# Patient Record
Sex: Female | Born: 1975 | Hispanic: Yes | Marital: Single | Smoking: Never smoker
Health system: Southern US, Community
[De-identification: ages and names within clinical notes are randomized; demographics above are authoritative.]

---

## 2014-09-03 ENCOUNTER — Encounter: Payer: Self-pay | Admitting: Physician Assistant

## 2014-09-03 ENCOUNTER — Ambulatory Visit (INDEPENDENT_AMBULATORY_CARE_PROVIDER_SITE_OTHER): Payer: 59 | Admitting: Physician Assistant

## 2014-09-03 VITALS — BP 117/73 | HR 96 | Ht 62.5 in | Wt 137.0 lb

## 2014-09-03 DIAGNOSIS — N912 Amenorrhea, unspecified: Secondary | ICD-10-CM | POA: Diagnosis not present

## 2014-09-03 DIAGNOSIS — M533 Sacrococcygeal disorders, not elsewhere classified: Secondary | ICD-10-CM

## 2014-09-03 DIAGNOSIS — R109 Unspecified abdominal pain: Secondary | ICD-10-CM

## 2014-09-03 DIAGNOSIS — Z131 Encounter for screening for diabetes mellitus: Secondary | ICD-10-CM

## 2014-09-03 DIAGNOSIS — Z1231 Encounter for screening mammogram for malignant neoplasm of breast: Secondary | ICD-10-CM

## 2014-09-03 DIAGNOSIS — E28319 Asymptomatic premature menopause: Secondary | ICD-10-CM

## 2014-09-03 DIAGNOSIS — R1032 Left lower quadrant pain: Secondary | ICD-10-CM

## 2014-09-03 DIAGNOSIS — Z13220 Encounter for screening for lipoid disorders: Secondary | ICD-10-CM

## 2014-09-03 MED ORDER — MELOXICAM 15 MG PO TABS: 15.0000 mg | ORAL_TABLET | Freq: Every day | ORAL | Status: DC

## 2014-09-03 MED ORDER — DICLOFENAC SODIUM 2 % TD SOLN: TRANSDERMAL | Status: DC

## 2014-09-03 MED ORDER — KETOROLAC TROMETHAMINE 60 MG/2ML IM SOLN
60.0000 mg | Freq: Once | INTRAMUSCULAR | Status: AC
  Administered 2014-09-03: 60 mg via INTRAMUSCULAR

## 2014-09-03 NOTE — Patient Instructions (Signed)
Start mobic daily.  pennsaid cream twice a day.  Get blood work needs to be fasting.  Follow up in 2 weeks.

## 2014-09-04 LAB — COMPLETE METABOLIC PANEL WITH GFR
ALK PHOS: 66 U/L (ref 39–117)
ALT: 17 U/L (ref 0–35)
AST: 22 U/L (ref 0–37)
Albumin: 3.9 g/dL (ref 3.5–5.2)
BUN: 11 mg/dL (ref 6–23)
CO2: 25 mEq/L (ref 19–32)
CREATININE: 0.58 mg/dL (ref 0.50–1.10)
Calcium: 9.1 mg/dL (ref 8.4–10.5)
Chloride: 103 mEq/L (ref 96–112)
GFR, Est Non African American: >89 mL/min
GLUCOSE: 79 mg/dL (ref 70–99)
Potassium: 4.1 mEq/L (ref 3.5–5.3)
Sodium: 137 mEq/L (ref 135–145)
TOTAL PROTEIN: 6.6 g/dL (ref 6.0–8.3)
Total Bilirubin: 0.3 mg/dL (ref 0.2–1.2)

## 2014-09-04 LAB — LIPID PANEL
CHOLESTEROL: 190 mg/dL (ref 0–200)
HDL: 54 mg/dL (ref >=46)
LDL CALC: 119 mg/dL — AB (ref 0–99)
Total CHOL/HDL Ratio: 3.5 Ratio
Triglycerides: 83 mg/dL (ref <150)
VLDL: 17 mg/dL (ref 0–40)

## 2014-09-05 ENCOUNTER — Telehealth: Payer: Self-pay | Admitting: *Deleted

## 2014-09-05 ENCOUNTER — Other Ambulatory Visit: Payer: Self-pay | Admitting: Physician Assistant

## 2014-09-05 DIAGNOSIS — E28319 Asymptomatic premature menopause: Secondary | ICD-10-CM | POA: Insufficient documentation

## 2014-09-05 LAB — FOLLICLE STIMULATING HORMONE: FSH: 55.8 m[IU]/mL

## 2014-09-05 LAB — LUTEINIZING HORMONE: LH: 45.5 m[IU]/mL

## 2014-09-05 MED ORDER — TRAMADOL HCL 50 MG PO TABS: 50.0000 mg | ORAL_TABLET | Freq: Three times a day (TID) | ORAL | Status: DC | PRN

## 2014-09-05 NOTE — Telephone Encounter (Signed)
Called & spoke with Lisa Randolph @ [phone] for Ms. Lisa Randolph Informed that Rx is ready for pick-up & could be picked  @ the urgent care.  And [facility] if no relief after taking Rx.

## 2014-09-05 NOTE — Telephone Encounter (Signed)
Patient came in for visit on 4/11 with SI joint pain. Which she received an injection.  Patient stated that pain has now returned  & is unbearable. Wonders if anything can help ease the pain

## 2014-09-05 NOTE — Telephone Encounter (Signed)
Closing accidental encouter

## 2014-09-05 NOTE — Telephone Encounter (Signed)
Can pick up tramadol prescription as needed. If no improvement in next week then lets get in to formal PT.  i will print off tramadol rx.

## 2014-09-07 ENCOUNTER — Telehealth: Payer: Self-pay | Admitting: *Deleted

## 2014-09-07 NOTE — Telephone Encounter (Signed)
Patients friend [name] called asking if Mrs Lisa Randolph could have menopausal medication  because she is having bad effects.

## 2014-09-09 ENCOUNTER — Encounter: Payer: Self-pay | Admitting: Physician Assistant

## 2014-09-09 ENCOUNTER — Telehealth: Payer: Self-pay | Admitting: Physician Assistant

## 2014-09-09 DIAGNOSIS — M533 Sacrococcygeal disorders, not elsewhere classified: Secondary | ICD-10-CM | POA: Insufficient documentation

## 2014-09-09 DIAGNOSIS — N912 Amenorrhea, unspecified: Secondary | ICD-10-CM | POA: Insufficient documentation

## 2014-09-09 DIAGNOSIS — R1032 Left lower quadrant pain: Secondary | ICD-10-CM | POA: Insufficient documentation

## 2014-09-09 DIAGNOSIS — R109 Unspecified abdominal pain: Secondary | ICD-10-CM | POA: Insufficient documentation

## 2014-09-09 NOTE — Telephone Encounter (Signed)
Disregard last msg i saw in historical medications.

## 2014-09-09 NOTE — Telephone Encounter (Signed)
Let's evaluate with pelvic and transvaginal ultrasound first. Will order.

## 2014-09-09 NOTE — Telephone Encounter (Signed)
i never saw injection logged for toradol that was given?

## 2014-09-09 NOTE — Progress Notes (Signed)
   Subjective:    Patient ID: Lisa Randolph, female    DOB: 07/09/1975, 39 y.o.   MRN: [id number]  HPI Pt is a 39 yo female who presents to the clinic to establish care.   No known PMH>  No know FMH.  No medications.  No surgeries.   .. History   Social History  . Marital Status: Single    Spouse Name: N/A  . Number of Children: N/A  . Years of Education: N/A   Occupational History  . Not on file.   Social History Main Topics  . Smoking status: Never Smoker   . Smokeless tobacco: Not on file  . Alcohol Use: No  . Drug Use: No  . Sexual Activity: Not on file   Other Topics Concern  . Not on file   Social History Narrative   Pt is hispanic and bring in friend to help interpret.   Pt has some left lower back pain. Worse when sitting and better with walking. Present off and on for years but seems to be getting worse. No known trauma. No radiation down into legs. No saddle anesthesia. Not tried anything to make better   Pt also has not had a period since January. She is also having abdominal pressure and some pain. She has had a few hot flashes.    Review of Systems  All other systems reviewed and are negative.      Objective:   Physical Exam  Constitutional: She is oriented to person, place, and time. She appears well-developed and well-nourished.  HENT:  Head: Normocephalic and atraumatic.  Cardiovascular: Normal rate, regular rhythm and normal heart sounds.   Pulmonary/Chest: Effort normal and breath sounds normal.  Abdominal: Soft. Bowel sounds are normal. She exhibits no distension and no mass. There is no tenderness. There is no rebound and no guarding.  Musculoskeletal:  Pain to palpation over left lower back only.  No pain over lumbar spine.  Negative straight leg test.  No pain over hip bursa.   Neurological: She is alert and oriented to person, place, and time.  Skin: Skin is dry.  Psychiatric: She has a normal mood and affect. Her behavior is normal.           Assessment & Plan:  SI joint dysfunction- Toradol 60mg  IM given today. Will hold off on exercises. Given mobic to start tomorrow and take daily for 2 weeks. pennsaid over effected area bid. Ice area. Follow up in 2 weeks. Pt called later and did send some tramadol for acute pain.   Amenorrhea/left sided abdominal pressure and pain- will get labs [facility] and men[facility]. Will get pelvic ultrasound for pain and pressure.   Pt needs CPE.  Will order mammogram.  Follow up with pap.

## 2014-09-10 NOTE — Telephone Encounter (Signed)
Left Vm for [name] Mrs Shawnie DapperLopez friend that [facility] suggested:    Let's evaluate with pelvic and transvaginal ultrasound first

## 2014-09-10 NOTE — Telephone Encounter (Signed)
Also can we see how patients left lower back pain is doing?

## 2014-09-14 ENCOUNTER — Ambulatory Visit (INDEPENDENT_AMBULATORY_CARE_PROVIDER_SITE_OTHER): Payer: 59

## 2014-09-14 DIAGNOSIS — N858 Other specified noninflammatory disorders of uterus: Secondary | ICD-10-CM

## 2014-09-17 ENCOUNTER — Ambulatory Visit (INDEPENDENT_AMBULATORY_CARE_PROVIDER_SITE_OTHER): Payer: 59 | Admitting: Physician Assistant

## 2014-09-17 ENCOUNTER — Ambulatory Visit (INDEPENDENT_AMBULATORY_CARE_PROVIDER_SITE_OTHER): Payer: 59

## 2014-09-17 ENCOUNTER — Encounter: Payer: Self-pay | Admitting: Physician Assistant

## 2014-09-17 ENCOUNTER — Other Ambulatory Visit (HOSPITAL_COMMUNITY)
Admission: RE | Admit: 2014-09-17 | Discharge: 2014-09-17 | Disposition: A | Discharge status: Home / Self Care | Payer: 59 | Source: Ambulatory Visit | Attending: Physician Assistant | Admitting: Physician Assistant

## 2014-09-17 VITALS — BP 104/64 | HR 88 | Ht 62.5 in | Wt 132.0 lb

## 2014-09-17 DIAGNOSIS — Z0000 Encounter for general adult medical examination without abnormal findings: Secondary | ICD-10-CM

## 2014-09-17 DIAGNOSIS — E049 Nontoxic goiter, unspecified: Secondary | ICD-10-CM | POA: Diagnosis not present

## 2014-09-17 DIAGNOSIS — N951 Menopausal and female climacteric states: Secondary | ICD-10-CM

## 2014-09-17 DIAGNOSIS — M545 Low back pain, unspecified: Secondary | ICD-10-CM

## 2014-09-17 DIAGNOSIS — Z1151 Encounter for screening for human papillomavirus (HPV): Secondary | ICD-10-CM | POA: Diagnosis present

## 2014-09-17 DIAGNOSIS — Z01419 Encounter for gynecological examination (general) (routine) without abnormal findings: Secondary | ICD-10-CM

## 2014-09-17 DIAGNOSIS — N760 Acute vaginitis: Secondary | ICD-10-CM | POA: Insufficient documentation

## 2014-09-17 DIAGNOSIS — Z1239 Encounter for other screening for malignant neoplasm of breast: Secondary | ICD-10-CM

## 2014-09-17 DIAGNOSIS — Z23 Encounter for immunization: Secondary | ICD-10-CM

## 2014-09-17 DIAGNOSIS — M5136 Other intervertebral disc degeneration, lumbar region: Secondary | ICD-10-CM

## 2014-09-17 DIAGNOSIS — Z113 Encounter for screening for infections with a predominantly sexual mode of transmission: Secondary | ICD-10-CM | POA: Diagnosis present

## 2014-09-17 DIAGNOSIS — M533 Sacrococcygeal disorders, not elsewhere classified: Secondary | ICD-10-CM | POA: Diagnosis not present

## 2014-09-17 MED ORDER — MELOXICAM 15 MG PO TABS: 15.0000 mg | ORAL_TABLET | Freq: Every day | ORAL | Status: DC

## 2014-09-17 MED ORDER — ESTRADIOL 0.025 MG/24HR TD PTTW: 1.0000 | MEDICATED_PATCH | TRANSDERMAL | Status: DC

## 2014-09-17 MED ORDER — PROGESTERONE MICRONIZED 200 MG PO CAPS: 200.0000 mg | ORAL_CAPSULE | Freq: Every day | ORAL | Status: DC

## 2014-09-17 NOTE — Patient Instructions (Addendum)
Menopause Menopause is the normal time of life when menstrual periods stop completely. Menopause is complete when you have missed 12 consecutive menstrual periods. It usually occurs between the ages of 48 years and 55 years. Very rarely does a woman develop menopause before the age of 40 years. At menopause, your ovaries stop producing the female hormones estrogen and progesterone. This can cause undesirable symptoms and also affect your health. Sometimes the symptoms may occur 4-5 years before the menopause begins. There is no relationship between menopause and:  Oral contraceptives.  Number of children you had.  Race.  The age your menstrual periods started (menarche). Heavy smokers and very thin women may develop menopause earlier in life. CAUSES 1. The ovaries stop producing the female hormones estrogen and progesterone. 2. Other causes include: 1. Surgery to remove both ovaries. 2. The ovaries stop functioning for no known reason. 3. Tumors of the pituitary gland in the brain. 4. Medical disease that affects the ovaries and hormone production. 5. Radiation treatment to the abdomen or pelvis. 6. Chemotherapy that affects the ovaries. SYMPTOMS  1. Hot flashes. 2. Night sweats. 3. Decrease in sex drive. 4. Vaginal dryness and thinning of the vagina causing painful intercourse. 5. Dryness of the skin and developing wrinkles. 6. Headaches. 7. Tiredness. 8. Irritability. 9. Memory problems. 10. Weight gain. 11. Bladder infections. 12. Hair growth of the face and chest. 13. Infertility. More serious symptoms include: 1. Loss of bone (osteoporosis) causing breaks (fractures). 2. Depression. 3. Hardening and narrowing of the arteries (atherosclerosis) causing heart attacks and strokes. DIAGNOSIS   When the menstrual periods have stopped for 12 straight months.  Physical exam.  Hormone studies of the blood. TREATMENT  There are many treatment choices and nearly as many  questions about them. The decisions to treat or not to treat menopausal changes is an individual choice made with your health care provider. Your health care provider can discuss the treatments with you. Together, you can decide which treatment will work best for you. Your treatment choices may include:  1. Hormone therapy (estrogen and progesterone). 2. Non-hormonal medicines. 3. Treating the individual symptoms with medicine (for example antidepressants for depression). 4. Herbal medicines that may help specific symptoms. 5. Counseling by a psychiatrist or psychologist. 6. Group therapy. 7. Lifestyle changes including: 1. Eating healthy. 2. Regular exercise. 3. Limiting caffeine and alcohol. 4. Stress management and meditation. 8. No treatment. HOME CARE INSTRUCTIONS   Take the medicine your health care provider gives you as directed.  Get plenty of sleep and rest.  Exercise regularly.  Eat a diet that contains calcium (good for the bones) and soy products (acts like estrogen hormone).  Avoid alcoholic beverages.  Do not smoke.  If you have hot flashes, dress in layers.  Take supplements, calcium, and vitamin D to strengthen bones.  You can use over-the-counter lubricants or moisturizers for vaginal dryness.  Group therapy is sometimes very helpful.  Acupuncture may be helpful in some cases. SEEK MEDICAL CARE IF:   You are not sure you are in menopause.  You are having menopausal symptoms and need advice and treatment.  You are still having menstrual periods after age 36 years.  You have pain with intercourse.  Menopause is complete (no menstrual period for 12 months) and you develop vaginal bleeding.  You need a referral to a specialist (gynecologist, psychiatrist, or psychologist) for treatment. SEEK IMMEDIATE MEDICAL CARE IF:   You have severe depression.  You have excessive vaginal bleeding.  You fell and think you have a broken bone.  You have pain when  you urinate.  You develop leg or chest pain.  You have a fast pounding heart beat (palpitations).  You have severe headaches.  You develop vision problems.  You feel a lump in your breast.  You have abdominal pain or severe indigestion. Document Released: 08/01/2003 Document Revised: 01/11/2013 Document Reviewed: 12/08/2012 [facility] Patient Information 2015 [place], [place]. This information is not intended to replace advice given to you by your health care provider. Make sure you discuss any questions you have with your health care provider.   Keeping You Healthy  Get These Tests 3. Blood Pressure- Have your blood pressure checked once a year by your health care provider.  Normal blood pressure is 120/80. 4. Weight- Have your body mass index (BMI) calculated to screen for obesity.  BMI is measure of body fat based on height and weight.  You can also calculate your own BMI at https://www.west-esparza.com/. 5. Cholesterol- Have your cholesterol checked every 5 years starting at age 80 then yearly starting at age 63. 39. Chlamydia, HIV, and other sexually transmitted diseases- Get screened every year until age 28, then within three months of each new sexual provider. 7. Pap Smear- Every 1-5 years; discuss with your health care provider. 8. Mammogram- Every 1-2 years starting at age 68--50  Take these medicines  Calcium with Vitamin D-Your body needs 1200 mg of Calcium each day and [phone] IU of Vitamin D daily.  Your body can only absorb 500 mg of Calcium at a time so Calcium must be taken in 2 or 3 divided doses throughout the day.  Multivitamin with folic acid- Once daily if it is possible for you to become pregnant.  Get these Immunizations  Gardasil-Series of three doses; prevents HPV related illness such as genital warts and cervical cancer.  Menactra-Single dose; prevents meningitis.  Tetanus shot- Every 10 years.  Flu shot-Every year.  Take these steps 9. Do not smoke-Your  healthcare provider can help you quit.  For tips on how to quit go to www.smokefree.gov or call 1-800 QUITNOW. 10. Be physically active- Exercise 5 days a week for at least 30 minutes.  If you are not already physically active, start slow and gradually work up to 30 minutes of moderate physical activity.  Examples of moderate activity include walking briskly, dancing, swimming, bicycling, etc. 11. Breast Cancer- A self breast exam every month is important for early detection of breast cancer.  For more information and instruction on self breast exams, ask your healthcare provider or [email or web address]. 12. Eat a healthy diet- Eat a variety of healthy foods such as fruits, vegetables, whole grains, low fat milk, low fat cheeses, yogurt, lean meats, poultry and fish, beans, nuts, tofu, etc.  For more information go to www. Thenutritionsource.org 13. Drink alcohol in moderation- Limit alcohol intake to one drink or less per day. Never drink and drive. 14. Depression- Your emotional health is as important as your physical health.  If you're feeling down or losing interest in things you normally enjoy please talk to your healthcare provider about being screened for depression. 15. Dental visit- Brush and floss your teeth twice daily; visit your dentist twice a year. 16. Eye doctor- Get an eye exam at least every 2 years. 17. Helmet use- Always wear a helmet when riding a bicycle, motorcycle, rollerblading or skateboarding. 18. Safe sex- If you may be exposed to sexually transmitted infections, use a condom. 19. Seat  belts- Seat belts can save your live; always wear one. 20. Smoke/Carbon Monoxide detectors- These detectors need to be installed on the appropriate level of your home. Replace batteries at least once a year. 21. Skin cancer- When out in the sun please cover up and use sunscreen 15 SPF or higher. 22. Violence- If anyone is threatening or hurting you, please tell your  healthcare provider.

## 2014-09-18 LAB — VITAMIN B12: Vitamin B-12: 374 pg/mL (ref 211–911)

## 2014-09-18 LAB — TSH: TSH: 0.619 u[IU]/mL (ref 0.350–4.500)

## 2014-09-18 LAB — VITAMIN D 25 HYDROXY (VIT D DEFICIENCY, FRACTURES): Vit D, 25-Hydroxy: 26 ng/mL — ABNORMAL LOW (ref 30–100)

## 2014-09-19 LAB — CYTOLOGY - PAP

## 2014-09-20 ENCOUNTER — Ambulatory Visit: Payer: 59

## 2014-09-20 LAB — CERVICOVAGINAL ANCILLARY ONLY
Bacterial vaginitis: NEGATIVE
Candida vaginitis: NEGATIVE

## 2014-09-24 ENCOUNTER — Ambulatory Visit (INDEPENDENT_AMBULATORY_CARE_PROVIDER_SITE_OTHER): Payer: 59 | Admitting: Family Medicine

## 2014-09-24 ENCOUNTER — Encounter: Payer: Self-pay | Admitting: Family Medicine

## 2014-09-24 VITALS — BP 102/67 | HR 92 | Ht 64.0 in | Wt 133.0 lb

## 2014-09-24 DIAGNOSIS — M5416 Radiculopathy, lumbar region: Secondary | ICD-10-CM

## 2014-09-24 LAB — CERVICOVAGINAL ANCILLARY ONLY: Herpes: NEGATIVE

## 2014-09-24 MED ORDER — HYDROCODONE-ACETAMINOPHEN 10-325 MG PO TABS: 1.0000 | ORAL_TABLET | Freq: Three times a day (TID) | ORAL | Status: DC | PRN

## 2014-09-24 MED ORDER — PREDNISONE 20 MG PO TABS: ORAL_TABLET | ORAL | Status: AC

## 2014-09-24 NOTE — Progress Notes (Signed)
CC: Lisa Randolph is a 39 y.o. female is here for pain and numbness on left side   Subjective: HPI:  Accompanied by third party interpreter  Complains of left leg and lower back pain. It's been present for about a week now. It got incredibly worse over the weekend. It's described as constant, electric and stinging sensation. Nothing particularly makes it worse but it is slightly improved with tramadol or meloxicam she can't remember. She's never had this before. She denies any recent injury or overexertion. She denies weakness. The pain starts in her left lower back radiates into the buttock and then down the back of her leg. She denies any overlying skin changes the site of discomfort. There's been no weakness or any other motor or sensory disturbances.  Denies fevers, chills, bowel or bladder incontinence, nor any other motor or sensory disturbances in the lower judgment is other than that described above   Review Of Systems Outlined In HPI  No past medical history on file.  No past surgical history on file. Family History  Problem Relation Age of Onset  . Family history unknown: Yes    History   Social History  . Marital Status: Single    Spouse Name: N/A  . Number of Children: N/A  . Years of Education: N/A   Occupational History  . Not on file.   Social History Main Topics  . Smoking status: Never Smoker   . Smokeless tobacco: Not on file  . Alcohol Use: No  . Drug Use: No  . Sexual Activity: Not on file   Other Topics Concern  . Not on file   Social History Narrative     Objective: BP 102/67 mmHg  Pulse 92  Ht 5\' 4"  (1.626 m)  Wt 133 lb (60.328 kg)  BMI 22.82 kg/m2  LMP 06/18/2014  Vital signs reviewed. General: Alert and Oriented, No Acute Distress HEENT: Pupils equal, round, reactive to light. Conjunctivae clear.  External ears unremarkable.  Moist mucous membranes. Lungs: Clear and comfortable work of breathing, speaking in full sentences without  accessory muscle use. Cardiac: Regular rate and rhythm.  Neuro: CN II-XII grossly intact, gait normal. Extremities: No peripheral edema.  Strong peripheral pulses. L4 and S1 DTR 2 over 4 bilaterally and symmetric. Full range of motion and strength of both lower extremities. No overlying skin changes at the site of her discomfort. Straight leg raise is positive on the left.. Mental Status: No depression, anxiety, nor agitation. Logical though process. Skin: Warm and dry.  Assessment & Plan: Lisa MalkinOralia was seen today for pain and numbness on left side.  Diagnoses and all orders for this visit:  Lumbar radicular pain  Other orders -     predniSONE (DELTASONE) 20 MG tablet; Three tabs daily days 1-3, two tabs daily days 4-6, one tab daily days 7-9, half tab daily days 10-13. -     HYDROcodone-acetaminophen (NORCO) 10-325 MG per tablet; Take 1 tablet by mouth every 8 (eight) hours as needed.   Suspect lumbar radiculitis versus sciatica, Norco provided since she still having difficulty sleeping despite using tramadol. Stop meloxicam and switched to prednisone taper.  Return if symptoms worsen or fail to improve.

## 2014-09-25 DIAGNOSIS — E049 Nontoxic goiter, unspecified: Secondary | ICD-10-CM | POA: Insufficient documentation

## 2014-09-25 DIAGNOSIS — N951 Menopausal and female climacteric states: Secondary | ICD-10-CM | POA: Insufficient documentation

## 2014-09-25 DIAGNOSIS — M545 Low back pain, unspecified: Secondary | ICD-10-CM | POA: Insufficient documentation

## 2014-09-25 NOTE — Progress Notes (Signed)
  Subjective:     Lisa Randolph is a 39 y.o. female and is here for a comprehensive physical exam. The patient reports pt has multiple problems and concerns. she is having lots of mood changes, hot flashes and just not feeling well. she has been seen before but left back aches and painful. she has no energy. Marland Kitchen.  History   Social History  . Marital Status: Single    Spouse Name: N/A  . Number of Children: N/A  . Years of Education: N/A   Occupational History  . Not on file.   Social History Main Topics  . Smoking status: Never Smoker   . Smokeless tobacco: Not on file  . Alcohol Use: No  . Drug Use: No  . Sexual Activity: Not on file   Other Topics Concern  . Not on file   Social History Narrative   Health Maintenance  Topic Date Due  . HIV Screening  06/26/1989  . INFLUENZA VACCINE  12/24/2014  . PAP SMEAR  09/16/2017  . TETANUS/TDAP  09/16/2024    The following portions of the patient's history were reviewed and updated as appropriate: allergies, current medications, past family history, past medical history, past social history, past surgical history and problem list.  Review of Systems A comprehensive review of systems was negative.   Objective:    BP 104/64 mmHg  Pulse 88  Ht 5' 2.5" (1.588 m)  Wt 132 lb (59.875 kg)  BMI 23.74 kg/m2  LMP 06/18/2014 General appearance: alert, cooperative and appears stated age Head: Normocephalic, without obvious abnormality, atraumatic Eyes: conjunctivae/corneas clear. PERRL, EOM's intact. Fundi benign. Ears: normal TM's and external ear canals both ears Nose: Nares normal. Septum midline. Mucosa normal. No drainage or sinus tenderness. Throat: lips, mucosa, and tongue normal; teeth and gums normal Neck: no adenopathy, no carotid bruit, no JVD, supple, symmetrical, trachea midline and bilateral thyroid enlargement.  Back: symmetric, no curvature. ROM normal. No CVA tenderness. Lungs: clear to auscultation  bilaterally Breasts: normal appearance, no masses or tenderness Heart: regular rate and rhythm, S1, S2 normal, no murmur, click, rub or gallop Abdomen: soft, non-tender; bowel sounds normal; no masses,  no organomegaly Pelvic: cervix normal in appearance, external genitalia normal, no adnexal masses or tenderness, no cervical motion tenderness, uterus normal size, shape, and consistency and vagina normal without discharge Extremities: extremities normal, atraumatic, no cyanosis or edema Pulses: 2+ and symmetric Skin: Skin color, texture, turgor normal. No rashes or lesions Lymph nodes: Cervical, supraclavicular, and axillary nodes normal. Neurologic: Grossly normal    Assessment:    Healthy female exam.      Plan:    cpe- pap done today. Fasting labs done. Ordered mammogram. Discussed vitamin d and calcium 800 units and 1200mg .   Menopausal symptoms- started vivelle with prometrium since intact uterus. Discussed risk of HrT with breast cancer and heart disease. Follow up in 3 months. Could explain some of fatigue and mood changes.   Left SI joint/low back pain- will get imaging today. Exercises given. mobic given to start. May need to consider PT.  See After Visit Summary for Counseling Recommendations

## 2014-10-17 ENCOUNTER — Ambulatory Visit: Payer: 59

## 2014-10-17 ENCOUNTER — Ambulatory Visit (INDEPENDENT_AMBULATORY_CARE_PROVIDER_SITE_OTHER): Payer: 59

## 2014-10-17 DIAGNOSIS — Z1231 Encounter for screening mammogram for malignant neoplasm of breast: Secondary | ICD-10-CM | POA: Diagnosis not present

## 2014-10-17 DIAGNOSIS — Z1239 Encounter for other screening for malignant neoplasm of breast: Secondary | ICD-10-CM

## 2015-07-09 ENCOUNTER — Encounter: Payer: Self-pay | Admitting: Physician Assistant

## 2015-07-09 ENCOUNTER — Ambulatory Visit (INDEPENDENT_AMBULATORY_CARE_PROVIDER_SITE_OTHER): Payer: BLUE CROSS/BLUE SHIELD | Admitting: Physician Assistant

## 2015-07-09 VITALS — BP 99/61 | HR 114 | Temp 101.9°F | Ht 64.0 in | Wt 134.0 lb

## 2015-07-09 DIAGNOSIS — R509 Fever, unspecified: Secondary | ICD-10-CM | POA: Diagnosis not present

## 2015-07-09 DIAGNOSIS — J101 Influenza due to other identified influenza virus with other respiratory manifestations: Secondary | ICD-10-CM | POA: Diagnosis not present

## 2015-07-09 DIAGNOSIS — R52 Pain, unspecified: Secondary | ICD-10-CM

## 2015-07-09 DIAGNOSIS — R519 Headache, unspecified: Secondary | ICD-10-CM

## 2015-07-09 DIAGNOSIS — R51 Headache: Secondary | ICD-10-CM | POA: Diagnosis not present

## 2015-07-09 LAB — POCT INFLUENZA A/B
Influenza A, POC: POSITIVE — AB
Influenza B, POC: NEGATIVE

## 2015-07-09 MED ORDER — OSELTAMIVIR PHOSPHATE 75 MG PO CAPS: 75.0000 mg | ORAL_CAPSULE | Freq: Two times a day (BID) | ORAL | Status: DC

## 2015-07-09 NOTE — Patient Instructions (Signed)
Gripe - Adultos  (Influenza, Adult)  La gripe es una infección viral del tracto respiratorio. Ocurre con más frecuencia en los meses de invierno, ya que las personas pasan más tiempo en contacto cercano. La gripe puede enfermarlo considerablemente. Se transmite fácilmente de una persona a otra (es contagiosa).  CAUSAS   La causa es un virus que infecta el tracto respiratorio. Puede contagiarse el virus al aspirar las gotitas que una persona infectada elimina al toser o estornudar. También puede contagiarse al tocar algo que fue recientemente contaminado con el virus y luego llevarse la mano a la boca, la nariz o los ojos.  RIESGOS Y COMPLICACIONES  Tendrá mayor riesgo de sufrir un resfrío grave si consume cigarrillos, es diabético, sufre una enfermedad cardíaca (como insuficiencia cardíaca) o pulmonar crónica (como asma) o si tiene debilitado el sistema inmunológico. Los ancianos y las mujeres embarazadas tienen más riesgo de sufrir infecciones graves. El problema más frecuente de la gripe es la infección pulmonar (neumonía). En algunos casos, este problema puede requerir atención médica de emergencia y poner en peligro la vida.  SIGNOS Y SÍNTOMAS   Los síntomas pueden durar entre 4 y 10 días y pueden ser:  · Fiebre.  · Escalofríos.  · Dolor de cabeza, dolores en el cuerpo y musculares.  · Dolor de garganta.  · Molestias en el pecho y tos.  · Pérdida del apetito.  · Debilidad o cansancio.  · Mareos.  · Náuseas o vómitos.  DIAGNÓSTICO   El diagnóstico se realiza según su historia clínica y un examen físico. Es necesario realizar un análisis de cultivo faríngeo o nasal para confirmar el diagnóstico.  TRATAMIENTO   En los casos leves, la gripe se cura sin tratamiento. El tratamiento está dirigido a aliviar los síntomas. En los casos más graves, el médico podrá recetar medicamentos antivirales para acortar el curso de la enfermedad. Los antibióticos no son eficaces, ya que la infección está causada por un virus y no una  bacteria.  INSTRUCCIONES PARA EL CUIDADO EN EL HOGAR   · Tome los medicamentos solamente como se lo haya indicado el médico.  · Utilice un humidificador de niebla fría para facilitar la respiración.  · Haga reposo hasta que la temperatura vuelva a ser normal. Generalmente esto lleva entre 3 y 4 días.  · Beba suficiente líquido para mantener la orina clara o de color amarillo pálido.  · Cúbrase la boca y la nariz al toser o estornudar, y lávese las manos muy bien para evitar que se propague el virus.  · Quédese en su casa y no concurra al trabajo o a la escuela hasta que la fiebre haya desaparecido al menos por un día completo.  PREVENCIÓN   La vacunación anual contra la gripe es la mejor manera de evitar enfermarse. Se recomienda ahora de manera rutinaria una vacuna anual contra la gripe a todos los adultos estadounidenses.  SOLICITE ATENCIÓN MÉDICA SI:  · Tiene dolor en el pecho, la tos empeora o tiene más mucosidad.  · Tiene náuseas, vómitos o diarrea.  · La fiebre regresa o empeora.  SOLICITE ATENCIÓN MÉDICA DE INMEDIATO SI:   · Tiene dificultad para respirar, le falta el aire o tiene la piel o las uñas azuladas.  · Presenta dolor intenso o entumecimiento en el cuello.  · Le duele la cabeza de forma repentina o tiene dolor en la cara o el oído.  · Tiene náuseas o vómitos que no puede controlar.  ASEGÚRESE DE QUE:   · Comprende   estas instrucciones.  · Controlará su afección.  · Recibirá ayuda de inmediato si no mejora o si empeora.     Esta información no tiene como fin reemplazar el consejo del médico. Asegúrese de hacerle al médico cualquier pregunta que tenga.     Document Released: 02/18/2005 Document Revised: 06/01/2014  Elsevier Interactive Patient Education ©2016 [employer]vier Inc.

## 2015-07-09 NOTE — Progress Notes (Addendum)
   Subjective:    Patient ID: Lisa Randolph, female    DOB: 1976/01/21, 40 y.o.   MRN: [id number]  HPI Pt is a 40 yo female who presents to the clinic with headache, fever, body aches for 3 days. She speaks spanish and brings her daughter in for interpretation. She has taken ibuprofen with little relief. She is very tired with no energy. She could not go to work today. No sick contacts. No cough, SOB, or wheezing.    Review of Systems  All other systems reviewed and are negative.      Objective:   Physical Exam  Constitutional: She is oriented to person, place, and time. She appears well-developed.  HENT:  Head: Normocephalic and atraumatic.  Right Ear: External ear normal.  Left Ear: External ear normal.  Nose: Nose normal.  Mouth/Throat: No oropharyngeal exudate.  TM's clear.  Oropharynx erythematous without tonsilar swelling or exudate.   Eyes:  Injected bilateral conjunctiva.  Neck: Normal range of motion. Neck supple.  Cardiovascular: Regular rhythm and normal heart sounds.   Tachycardia at 114.   Pulmonary/Chest: Effort normal and breath sounds normal. She has no wheezes.  Lymphadenopathy:    She has no cervical adenopathy.  Neurological: She is alert and oriented to person, place, and time.  Skin: Skin is warm.  Psychiatric: She has a normal mood and affect. Her behavior is normal.          Assessment & Plan:  Influenza- positive flu for influenza A. Treated with tamiflu for 5 days. Discussed symptomatic care. HO given. Follow up with any concerns or worsening symptoms. Do not return to work until fever free for 24 hours. Note given.

## 2015-07-23 ENCOUNTER — Ambulatory Visit: Payer: BLUE CROSS/BLUE SHIELD | Admitting: Physician Assistant

## 2015-09-09 ENCOUNTER — Ambulatory Visit: Payer: BLUE CROSS/BLUE SHIELD | Admitting: Physician Assistant

## 2015-09-17 ENCOUNTER — Ambulatory Visit (INDEPENDENT_AMBULATORY_CARE_PROVIDER_SITE_OTHER): Payer: BLUE CROSS/BLUE SHIELD | Admitting: Physician Assistant

## 2015-09-17 ENCOUNTER — Encounter: Payer: Self-pay | Admitting: Physician Assistant

## 2015-09-17 VITALS — BP 109/58 | HR 73 | Ht 64.0 in | Wt 134.0 lb

## 2015-09-17 DIAGNOSIS — R300 Dysuria: Secondary | ICD-10-CM | POA: Diagnosis not present

## 2015-09-17 MED ORDER — NITROFURANTOIN MONOHYD MACRO 100 MG PO CAPS: 100.0000 mg | ORAL_CAPSULE | Freq: Two times a day (BID) | ORAL | Status: DC

## 2015-09-17 MED ORDER — TRAZODONE HCL 50 MG PO TABS: 50.0000 mg | ORAL_TABLET | Freq: Every evening | ORAL | Status: AC | PRN

## 2015-09-17 MED ORDER — PHENAZOPYRIDINE HCL 200 MG PO TABS: 200.0000 mg | ORAL_TABLET | Freq: Three times a day (TID) | ORAL | Status: AC

## 2015-09-17 NOTE — Patient Instructions (Addendum)
  Infeccin urinaria  (Urinary Tract Infection)  La infeccin urinaria puede ocurrir en cualquier lugar del tracto urinario. El tracto urinario es un sistema de drenaje del cuerpo por el que se eliminan los desechos y el exceso de agua. El tracto urinario est formado por dos riones, dos urteres, la vejiga y la uretra. Los riones son rganos que tienen forma de frijol. Cada rin tiene aproximadamente el tamao del puo. Estn situados debajo de las costillas, uno a cada lado de la columna vertebral CAUSAS  La causa de la infeccin son los microbios, que son organismos microscpicos, que incluyen hongos, virus, y bacterias. Estos organismos son tan pequeos que slo pueden verse a travs del microscopio. Las bacterias son los microorganismos que ms comnmente causan infecciones urinarias.  SNTOMAS  Los sntomas pueden variar segn la edad y el sexo del paciente y por la ubicacin de la infeccin. Los sntomas en las mujeres jvenes incluyen la necesidad frecuente e intensa de orinar y una sensacin dolorosa de ardor en la vejiga o en la uretra durante la miccin. Las mujeres y los hombres mayores podrn sentir cansancio, temblores y debilidad y sentir dolores musculares y dolor abdominal. Si tiene fiebre, puede significar que la infeccin est en los riones. Otros sntomas son dolor en la espalda o en los lados debajo de las costillas, nuseas y vmitos.  DIAGNSTICO  Para diagnosticar una infeccin urinaria, el mdico le preguntar acerca de sus sntomas. Tambin le solicitar una muestra de orina. La muestra de orina se analiza para detectar bacterias y glbulos blancos de la sangre. Los glbulos blancos se forman en el organismo para ayudar a combatir las infecciones.  TRATAMIENTO  Por lo general, las infecciones urinarias pueden tratarse con medicamentos. Debido a que la mayora de las infecciones son causadas por bacterias, por lo general pueden tratarse con antibiticos. La eleccin del  antibitico y la duracin del tratamiento depender de sus sntomas y el tipo de bacteria causante de la infeccin.  INSTRUCCIONES PARA EL CUIDADO EN EL HOGAR   Si le recetaron antibiticos, tmelos exactamente como su mdico le indique. Termine el medicamento aunque se sienta mejor despus de haber tomado slo algunos.  Beba gran cantidad de lquido para mantener la orina de tono claro o color amarillo plido.  Evite la cafena, el t y las bebidas gaseosas. Estas sustancias irritan la vejiga.  Vaciar la vejiga con frecuencia. Evite retener la orina durante largos perodos.  Vace la vejiga antes y despus de tener relaciones sexuales.  Despus de mover el intestino, las mujeres deben higienizarse la regin perineal desde adelante hacia atrs. Use slo un papel tissue por vez. SOLICITE ATENCIN MDICA SI:   Siente dolor en la espalda.  Le sube la fiebre.  Los sntomas no mejoran luego de 3 das. SOLICITE ATENCIN MDICA DE INMEDIATO SI:   Siente dolor intenso en la espalda o en la zona inferior del abdomen.  Comienza a sentir escalofros.  Tiene nuseas o vmitos.  Tiene una sensacin continua de quemazn o molestias al orinar. ASEGRESE DE QUE:   Comprende estas instrucciones.  Controlar su enfermedad.  Solicitar ayuda de inmediato si no mejora o empeora.   Esta informacin no tiene como fin reemplazar el consejo del mdico. Asegrese de hacerle al mdico cualquier pregunta que tenga.   Document Released: 02/18/2005 Document Revised: 02/03/2012 Elsevier Interactive Patient Education 2016 Elsevier Inc.  

## 2015-09-17 NOTE — Progress Notes (Addendum)
   Subjective:    Patient ID: Lisa Randolph, female    DOB: 08/02/1975, 40 y.o.   MRN: [id number]  HPI Patient is here today complaining of pain and burning with urination x2-3 weeks. States that pain comes and goes and she is symptom free some days. She reports occasional urgency but is unable to produce any urine at those times. Denies fever, chills, and back pain. She does have some suprapubic pain with urination. Denies nausea and vomiting.   Patient also complaining of difficulty falling asleep x several months that has worsened over the past three weeks. She has never taken any medication to help with sleep but has tried some teas which she states have helped. She is interested in starting a medication to help with sleep at this time.    Review of Systems  Constitutional: Negative for fever, chills and fatigue.  Genitourinary: Positive for dysuria, urgency, difficulty urinating and pelvic pain. Negative for frequency and flank pain.  All other systems reviewed and are negative.      Objective:   Physical Exam  Constitutional: She appears well-developed and well-nourished.  HENT:  Head: Normocephalic and atraumatic.  Cardiovascular: Normal rate, regular rhythm and normal heart sounds.   Pulmonary/Chest: Effort normal and breath sounds normal. No respiratory distress. She has no wheezes. She has no rales. She exhibits no tenderness.  Abdominal: Soft. Bowel sounds are normal. She exhibits no distension and no mass. There is no tenderness. There is no rebound and no guarding.  Skin: Skin is warm and dry.       Assessment & Plan:  1. Dysuria/Urgency- Patient presents with complaints of pain and burning with urination. UA revealed small blood, -nitrates and -leukocytes. Given ongoing symptoms, will send urine for culture and treat empirically with Macrobid 2x daily x 7 days pending culture also sent pyridium for inflammation.   2. Insomnia- Patient presents with difficulty falling  asleep. After discussing OTC and prescription options, patient would like to start Trazodone at this time. Will send Trazadone 50 mg for patient to take 1/2 tab nightly. Follow up as needed.

## 2015-09-19 ENCOUNTER — Encounter: Payer: Self-pay | Admitting: *Deleted

## 2015-09-19 LAB — URINE CULTURE
Colony Count: NO GROWTH
Organism ID, Bacteria: NO GROWTH

## 2015-10-15 ENCOUNTER — Telehealth: Payer: Self-pay

## 2015-10-15 ENCOUNTER — Other Ambulatory Visit: Payer: Self-pay

## 2015-10-15 ENCOUNTER — Ambulatory Visit (INDEPENDENT_AMBULATORY_CARE_PROVIDER_SITE_OTHER): Payer: BLUE CROSS/BLUE SHIELD

## 2015-10-15 ENCOUNTER — Encounter: Payer: Self-pay | Admitting: Physician Assistant

## 2015-10-15 ENCOUNTER — Ambulatory Visit (INDEPENDENT_AMBULATORY_CARE_PROVIDER_SITE_OTHER): Payer: BLUE CROSS/BLUE SHIELD | Admitting: Physician Assistant

## 2015-10-15 ENCOUNTER — Other Ambulatory Visit: Payer: Self-pay | Admitting: Physician Assistant

## 2015-10-15 VITALS — BP 134/78 | HR 90 | Ht 64.0 in | Wt 139.0 lb

## 2015-10-15 DIAGNOSIS — N949 Unspecified condition associated with female genital organs and menstrual cycle: Secondary | ICD-10-CM

## 2015-10-15 DIAGNOSIS — B9689 Other specified bacterial agents as the cause of diseases classified elsewhere: Secondary | ICD-10-CM

## 2015-10-15 DIAGNOSIS — R1021 Pelvic and perineal pain right side: Secondary | ICD-10-CM | POA: Insufficient documentation

## 2015-10-15 DIAGNOSIS — R109 Unspecified abdominal pain: Secondary | ICD-10-CM | POA: Diagnosis not present

## 2015-10-15 DIAGNOSIS — N926 Irregular menstruation, unspecified: Secondary | ICD-10-CM

## 2015-10-15 DIAGNOSIS — R1031 Right lower quadrant pain: Secondary | ICD-10-CM | POA: Insufficient documentation

## 2015-10-15 DIAGNOSIS — N898 Other specified noninflammatory disorders of vagina: Secondary | ICD-10-CM

## 2015-10-15 DIAGNOSIS — R319 Hematuria, unspecified: Secondary | ICD-10-CM

## 2015-10-15 DIAGNOSIS — A499 Bacterial infection, unspecified: Secondary | ICD-10-CM

## 2015-10-15 DIAGNOSIS — N760 Acute vaginitis: Secondary | ICD-10-CM

## 2015-10-15 DIAGNOSIS — R102 Pelvic and perineal pain: Secondary | ICD-10-CM | POA: Insufficient documentation

## 2015-10-15 LAB — POCT URINALYSIS DIPSTICK
BILIRUBIN UA: NEGATIVE
GLUCOSE UA: NEGATIVE
KETONES UA: NEGATIVE
LEUKOCYTES UA: NEGATIVE
Nitrite, UA: NEGATIVE
Protein, UA: NEGATIVE
Spec Grav, UA: 1.015
Urobilinogen, UA: 0.2
pH, UA: 7.5

## 2015-10-15 LAB — WET PREP FOR TRICH, YEAST, CLUE
Trich, Wet Prep: NONE SEEN
YEAST WET PREP: NONE SEEN

## 2015-10-15 LAB — POCT URINE PREGNANCY: Preg Test, Ur: NEGATIVE

## 2015-10-15 MED ORDER — IBUPROFEN 800 MG PO TABS: 800.0000 mg | ORAL_TABLET | Freq: Three times a day (TID) | ORAL | Status: AC | PRN

## 2015-10-15 MED ORDER — METRONIDAZOLE 500 MG PO TABS
500.0000 mg | ORAL_TABLET | Freq: Two times a day (BID) | ORAL | Status: DC
Start: 2015-10-15 — End: 2015-10-25

## 2015-10-15 NOTE — Progress Notes (Signed)
   Subjective:    Patient ID: Lisa Spanralia Armato, female    DOB: 07/21/1975, 40 y.o.   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 2 weeks ago she fell forward landing on hands and falling to right side. Since fall she has had a pressure in her low abdomen. She "feels like something is going to fall out". She denies any dysuria, increase in urination, vaginal discharge. Pain is 7/10 at times and effects her sleep. She is sexually active with one partner. Does not use condoms. No fever, chills, n/v/d.   Review of Systems  All other systems reviewed and are negative.      Objective:   Physical Exam  Constitutional: She is oriented to person, place, and time. She appears well-developed and well-nourished.  Cardiovascular: Normal rate, regular rhythm and normal heart sounds.   Pulmonary/Chest: Effort normal and breath sounds normal.  No CVA tenderness.   Abdominal: Soft. Bowel sounds are normal. She exhibits no distension and no mass. There is tenderness. There is no rebound and no guarding.  Tenderness over right lower quadrant.   Genitourinary:  Cervix normal with thin white discharge.  No uterine or bladder prolapse.  Cervical adnexal tenderness with bimanuel exam to the right only.   Neurological: She is alert and oriented to person, place, and time.  Psychiatric: She has a normal mood and affect. Her behavior is normal.          Assessment & Plan:  RLQ tenderness/abdominal pressure/hematuria/vaginal discharge- no signs of uterine or bladder prolapse on exam. Wet prep STAT positive for BV. Sent metronidazole. UA positive for blood. Ordered culture. GC/Chlamydia ordered. U/S ordered due to pressure. Unclear etiology at this point.     Missed periods- UPT negative.

## 2015-10-15 NOTE — Telephone Encounter (Signed)
Sent script.  Unable to reach patient, home phone disconnected, was not working today, so unable to reach at work.

## 2015-10-15 NOTE — Telephone Encounter (Signed)
Will you let her know she has BV and send to the pharmacy 500mg  metronidazole 1 po bid for 7 days #14 NRF

## 2015-10-16 LAB — GC/CHLAMYDIA PROBE AMP
CT Probe RNA: NOT DETECTED
GC PROBE AMP APTIMA: NOT DETECTED

## 2015-10-17 ENCOUNTER — Telehealth: Payer: Self-pay | Admitting: *Deleted

## 2015-10-17 DIAGNOSIS — R1031 Right lower quadrant pain: Secondary | ICD-10-CM

## 2015-10-17 DIAGNOSIS — R102 Pelvic and perineal pain: Secondary | ICD-10-CM

## 2015-10-17 LAB — URINE CULTURE
Colony Count: NO GROWTH
ORGANISM ID, BACTERIA: NO GROWTH

## 2015-10-17 NOTE — Telephone Encounter (Signed)
GYN referral placed.

## 2015-10-25 ENCOUNTER — Encounter: Payer: Self-pay | Admitting: Physician Assistant

## 2015-10-25 ENCOUNTER — Ambulatory Visit (INDEPENDENT_AMBULATORY_CARE_PROVIDER_SITE_OTHER): Payer: BLUE CROSS/BLUE SHIELD | Admitting: Physician Assistant

## 2015-10-25 VITALS — BP 110/58 | HR 76 | Ht 64.0 in | Wt 139.0 lb

## 2015-10-25 DIAGNOSIS — K2990 Gastroduodenitis, unspecified, without bleeding: Secondary | ICD-10-CM

## 2015-10-25 DIAGNOSIS — R1013 Epigastric pain: Secondary | ICD-10-CM | POA: Diagnosis not present

## 2015-10-25 DIAGNOSIS — K2970 Gastritis, unspecified, without bleeding: Secondary | ICD-10-CM | POA: Diagnosis not present

## 2015-10-25 MED ORDER — SUCRALFATE 1 G PO TABS: 1.0000 g | ORAL_TABLET | Freq: Two times a day (BID) | ORAL | Status: AC

## 2015-10-25 MED ORDER — OMEPRAZOLE 40 MG PO CPDR: 40.0000 mg | DELAYED_RELEASE_CAPSULE | Freq: Every day | ORAL | Status: AC

## 2015-10-25 NOTE — Progress Notes (Signed)
   Subjective:    Patient ID: Lisa Randolph, female    DOB: 02/21/1976, 40 y.o.   MRN: [id number]  HPI  Pt presents to the clinic with 2 weeks of upper abdomen pain and feeling like there is a "knot". Describes pain as "sharp and burning". She does have acid reflux which she has never had before. Every time she eats acid is worse. She can feel acid all the way in her throat. The "knot" sensation comes and goes. No melena or hematochezia. RLQ pain resolved with ibuprofen. She has been taking more frequently but stopped thinking it might be making worse. No fever, chills, body aches. No nausea or vomiting.      Review of Systems  All other systems reviewed and are negative.      Objective:   Physical Exam  Constitutional: She is oriented to person, place, and time. She appears well-developed and well-nourished.  HENT:  Head: Normocephalic and atraumatic.  Cardiovascular: Normal rate, regular rhythm and normal heart sounds.   Pulmonary/Chest: Effort normal and breath sounds normal.  Abdominal: Soft. Bowel sounds are normal. She exhibits no distension and no mass. There is no rebound and no guarding.  Mild tenderness over epigastric area.  Negative murphys sign.  No rebound or guarding.   Neurological: She is alert and oriented to person, place, and time.  Psychiatric: She has a normal mood and affect. Her behavior is normal.          Assessment & Plan:  Epigastric pain/gastritis- no PPI tried. Breath test done to rule out h.pylori. GI cocktail given in office today. Started omeprazole and carafate. STOP Ibuprofen could be making problem worse. Certainly sound like could be hiatal hernia as well. HO given. Follow up in 4 weeks. If continues to complain of pain then will get abdominal U/S to rule out gallbladder disease.

## 2015-10-25 NOTE — Patient Instructions (Signed)
Hernia hiatal (Hiatal Hernia) Se produce una hernia hiatal cuando parte del estmago se desliza por arriba del msculo que separa el abdomen del trax (diafragma). Puede nacer con una hernia hiatal (congnita), o esta puede formarse con [employer]. En casi todos los casos de hernia hiatal, solo la parte superior del estmago se protruye.  Muchas personas tienen hernia hiatal asintomtica. Cuanto ms grande es la hernia, ms probabilidades hay de que haya sntomas. En algunos casos, una hernia hiatal permite que el cido estomacal retorne al tubo que lleva la comida desde la boca al estmago (esfago). Esto puede provocar sntomas de Geographical information systems officer. Los sntomas fuertes de [place] tal vez signifiquen que presenta una afeccin llamada enfermedad por reflujo gastroesofgico (ERGE).  [place] hernias hiatales se deben a una debilidad en el orificio (hiato) donde el esfago pasa a travs del diafragma y se une a la parte superior del [place]. Se puede nacer con una debilidad en el hiato, o bien puede desarrollarse una debilidad. [place] es un factor de riesgo importante de una hernia hiatal. Cualquier cosa que aumente la presin en el diafragma tambin puede incrementar el riesgo de tener una hernia hiatal. Esto incluye:  Embarazo.  Sobrepeso.  Estreimiento frecuente. SIGNOS Y SNTOMAS  Generalmente, las personas que tienen una hernia hiatal no presentan sntomas. Si estos aparecen, casi siempre se deben a ERGE. Estos pueden incluir:  Acidez.  Eructos.  Indigestin.  Dificultad para tragar.  Tos o sibilancias.  Dolor de Investment banker, operational.  Ronquera.  Dolor en el pecho. DIAGNSTICO  A veces, una hernia hiatal se detecta durante un examen que se hace por otro problema. El mdico puede sospechar la presencia de una hernia hiatal si usted tiene sntomas de [place]. Se pueden hacer estudios para diagnosticar ERGE. Estos pueden incluir:  Radiografas de estmago o de trax.  Trnsito  esofagogastroduodenal, un estudio radiogrfico del tubo digestivo que incluye el uso de un lquido terroso que se ingiere. El lquido se revela claramente en la radiografa.  Endoscopia. Este es un procedimiento para Copy interior del estmago mediante el uso de un tubo delgado y flexible que tiene una pequea cmara y [place] en el extremo. TRATAMIENTO  Si no tiene sntomas, tal vez no necesite tratamiento. Si tiene sntomas, el tratamiento puede incluir:  Cambios en la dieta y el estilo de vida para ayudar a disminuir los sntomas de [place].  Los medicamentos. Estos pueden incluir:  Anticidos de [employer].  Medicamentos que ayudan a Systems analyst con mayor rapidez.  Medicamentos que inhiben la produccin de cido estomacal (antagonistas de los receptoresH2).  Medicamentos ms potentes para reducir la cantidad de cido estomacal (inhibidores de la bomba de protones).  Tal vez deba someterse a ciruga para reparar la hernia, si otros tratamientos no son eficaces. [place] todos los [employer] se lo haya indicado el mdico.  Si fuma, deje de [place].  Intente llegar a un peso corporal saludable y [place].  Ingiera comidas pequeas con frecuencia en lugar de tres comidas abundantes por da. Esto evita que el estmago se llene demasiado.  Coma lentamente.  No se acueste enseguida despus de comer.  No coma durante 1 o 2horas antes de [place].  No tome bebidas que contengan cafena, entre ellas, bebidas cola, caf, cacao y t.  No beba alcohol.  Evite los alimentos que pueden intensificar los sntomas de [place]. Estos pueden incluir:  Comidas grasosas.  Lambert Mody  ctricas.  Otros alimentos y bebidas que contengan cido.  Evite ejercer presin sobre el abdomen. Cualquier cosa que ejerza presin sobre el abdomen aumenta la cantidad de cido que puede retornar al esfago.  No se agache, especialmente despus de  comer.  Eleve la cabecera de la cama con bloques debajo de las patas. Esto mantiene la cabeza y el esfago ms elevados que el Craigestmago.  No use prendas apretadas alrededor del pecho o del estmago.  Intente no hacer fuerza al defecar, al orinar o al levantar objetos pesados. SOLICITE ATENCIN MDICA SI:  No es posible controlar los sntomas con medicamentos o cambios en el estilo de vida.  Tiene dificultad para tragar.  Tiene tos o sibilancias que no desaparecen. SOLICITE ATENCIN MDICA DE INMEDIATO SI:  El dolor empeora.  El dolor se irradia a los Pinasbrazos, el cuello, la Whitestonemandbula, los dientes o la espalda.  Le falta el aire.  Suda sin motivo.  Tiene malestar estomacal (nuseas) o vmitos.  Vomita sangre.  Observa sangre brillante en la materia fecal.  La materia fecal es negra, de aspecto alquitranado.   Esta informacin no tiene Theme park managercomo fin reemplazar el consejo del mdico. Asegrese de hacerle al mdico cualquier pregunta que tenga.   Document Released: 08/27/2008 Document Revised: 06/01/2014 Elsevier Interactive Patient Education 2016 [employer] Gastritis - Adultos (Gastritis, Adult)  La gastrittis es la irritacin (inflamacin) de la membrana interna del estmago. Puede ser Neomia Dearuna enfermedad de inicio sbito (aguda) o de largo plazo (crnica). Si la gastritis no se trata, puede causar sangrado y lceras. CAUSAS  La gastritis se produce cuando la membrana que tapiza interiormente al estmago se debilita o se daa. Los jugos digestivos del estmago inflaman el revestimiento del estmago debilitado. El revestimiento del estmago puede debilitarse o daarse por una infeccin viral o bacteriana. La infeccin bacteriana ms comn es la infeccin por Helicobacter pylori. Tambin puede ser el resultado del consumo excesivo de alcohol, por el uso de ciertos medicamentos o porque hay demasiado cido en el estmago.  SNTOMAS  En algunos casos no hay sntomas. Si se presentan  sntomas, stos pueden ser:   Dolor o sensacin de ardor en la parte superior del abdomen.  Nuseas.  Vmitos.  Sensacin molesta de distensin despus de comer. DIAGNSTICO  El mdico puede diagnosticar gastritis segn los sntomas y el examen fsico. Para determinar la causa de la gastritis, el mdico podr:   Pedir anlisis de sangre o de materia fecal para diagnosticar la presencia de la bacteria H pylori.  Gastroscopa. Un tubo delgado y flexible (endoscopio) se pasa por Theatre stage managerel esfago hasta llegar al Teachers Insurance and Annuity Associationestmago. El endoscopio tiene [place]a luz y una cmara en el extremo. El mdico utilizar el endoscopio para observar el interior del Jobosestmago.  Tomar una muestra de tejido (biopsia) del estmago para examinarlo en el microscopio. TRATAMIENTO  Segn la causa de la gastritis podrn recetarle: Antibiticos, si la causa es una infeccin bacteriana, como una infeccin por H. pylori. Anticidos o bloqueadores H2, si hay demasiado cido en el estmago. El Office Depotmdico le aconsejar que deje de tomar aspirina, ibuprofeno u otros antiinflamatorios no esteroides (AINE).  INSTRUCCIONES PARA EL CUIDADO EN EL HOGAR   Tome slo medicamentos de venta libre o recetados, segn las indicaciones del mdico.  Si le han recetado antibiticos, tmelos segn las indicaciones. Tmelos todos, aunque se sienta mejor.  Debe ingerir gran cantidad de lquido para mantener la orina de tono claro o color amarillo plido.  Evite las comidas y bebidas que  empeoran los [place], como:  Bebidas con cafena o alcohlicas.  Chocolate.  Sabores a Advertising account planner.  Ajo y cebolla.  Comidas muy condimentadas.  Ctricos como naranjas, limones o limas.  Alimentos que contengan tomate, como salsas, [place] y pizza.  Alimentos fritos y Lexicographer.  Haga comidas pequeas durante Glass blower/designer de 3 comidas abundantes. SOLICITE ATENCIN MDICA DE INMEDIATO SI:   La materia fecal es negra o de color rojo oscuro.  Vomita sangre de color  rojo brillante o material similar a granos de caf.  No puede retener los lquidos.  El dolor abdominal empeora.  Tiene fiebre.  No mejora luego de 1 semana.  Tiene preguntas o preocupaciones. ASEGRESE DE QUE:   Comprende estas instrucciones.  Controlar su enfermedad.  Solicitar ayuda de inmediato si no mejora o si empeora.   Esta informacin no tiene Theme park manager el consejo del mdico. Asegrese de hacerle al mdico cualquier pregunta que tenga.   Document Released: 02/18/2005 Document Revised: 01/30/2015 Elsevier Interactive Patient Education [employer]

## 2015-10-28 LAB — H. PYLORI BREATH TEST: H. PYLORI BREATH TEST: NOT DETECTED

## 2015-11-27 ENCOUNTER — Ambulatory Visit: Payer: BLUE CROSS/BLUE SHIELD | Admitting: Physician Assistant

## 2015-12-04 ENCOUNTER — Ambulatory Visit: Payer: BLUE CROSS/BLUE SHIELD | Admitting: Physician Assistant

## 2016-10-28 IMAGING — US US TRANSVAGINAL NON-OB
1 series · 14 of 25 positions shown · non-contrast
Comparison: None

CLINICAL DATA: Left lower quadrant pain and pressure for 3 months.
Amenorrhea.



[Series 1: us transvaginal non-ob · 0.20mm/px · 14 of 106 slices shown]
[im 1/106]
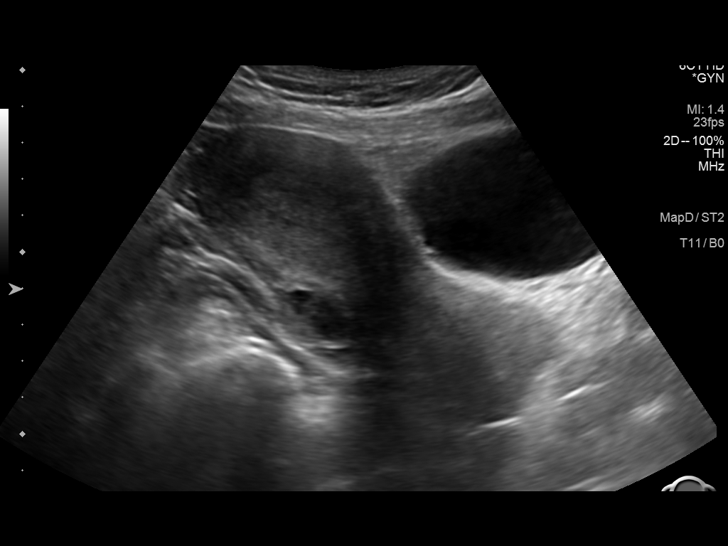
[im 9/106]
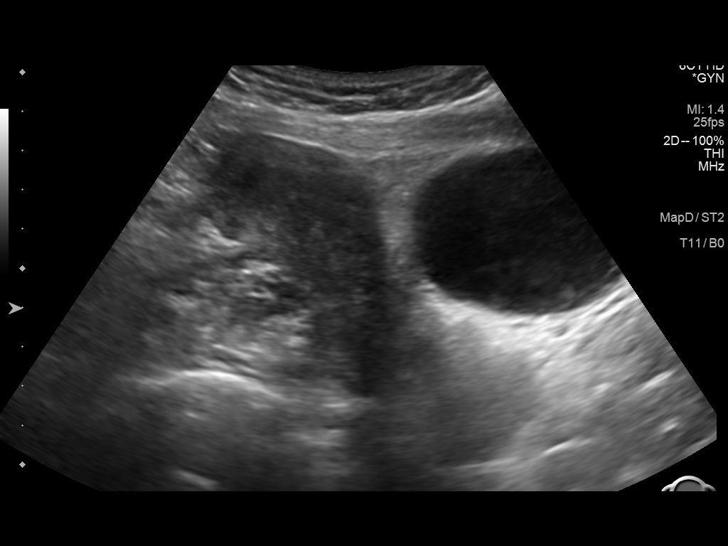
[im 18/106]
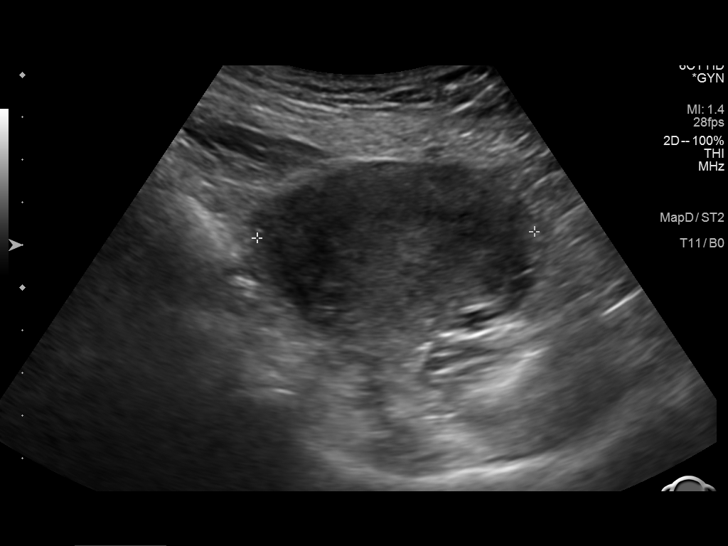
[im 27/106]
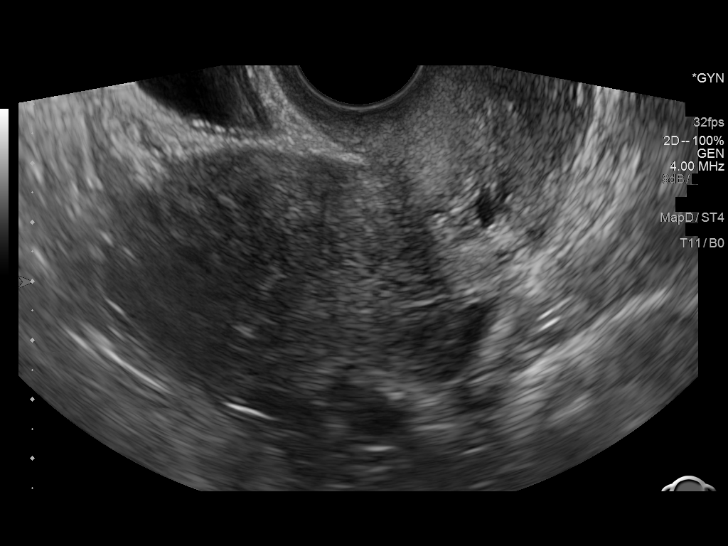
[im 36/106]
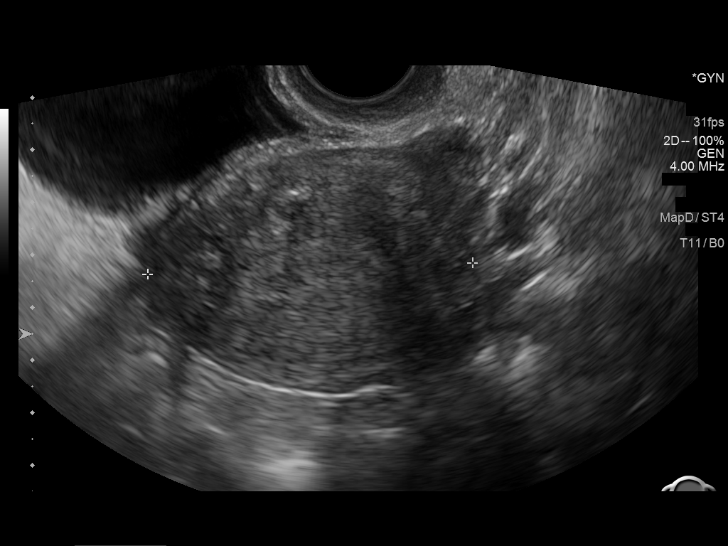
[im 40/106]
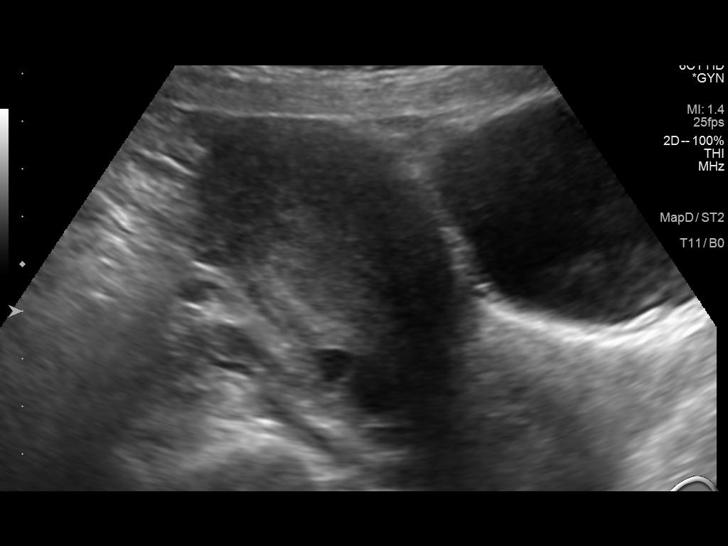
[im 49/106]
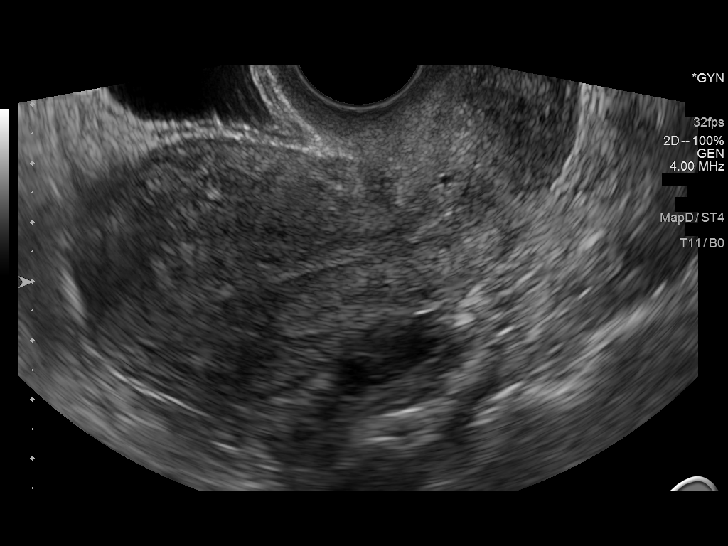
[im 57/106]
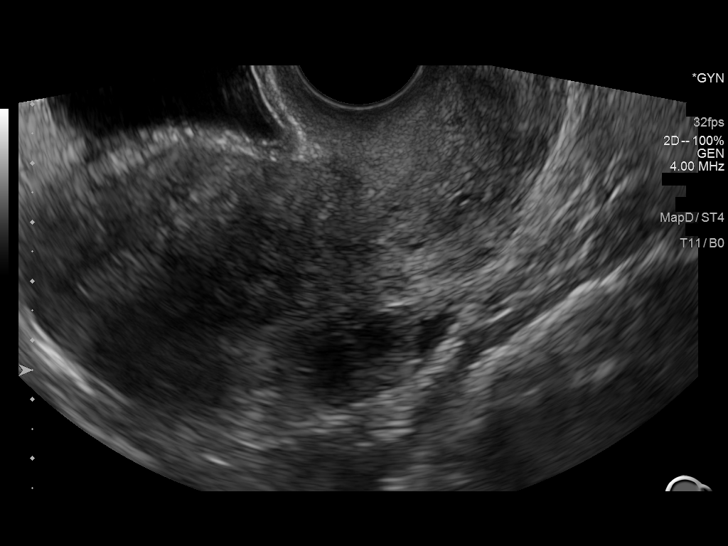
[im 66/106]
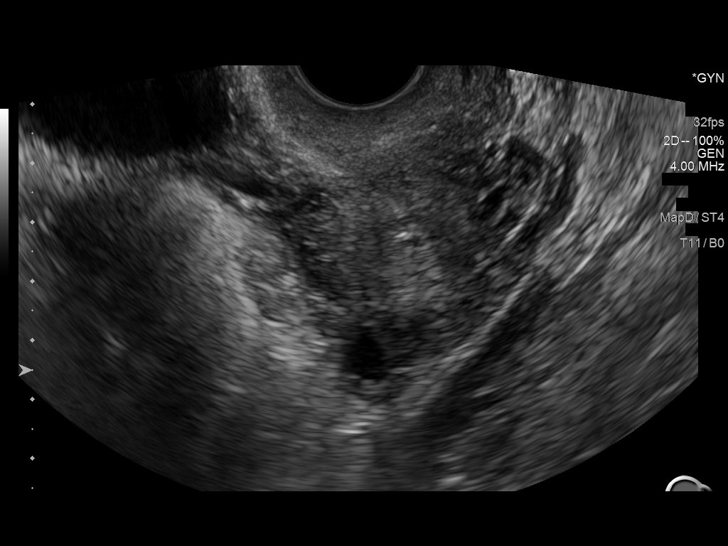
[im 71/106]
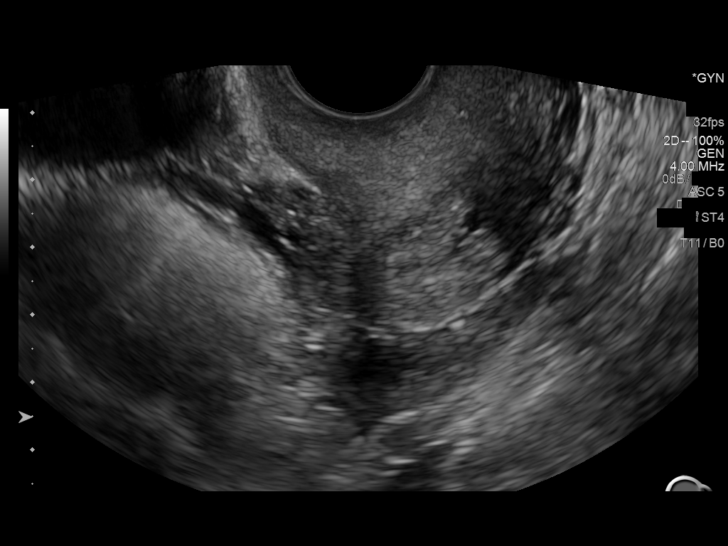
[im 79/106]
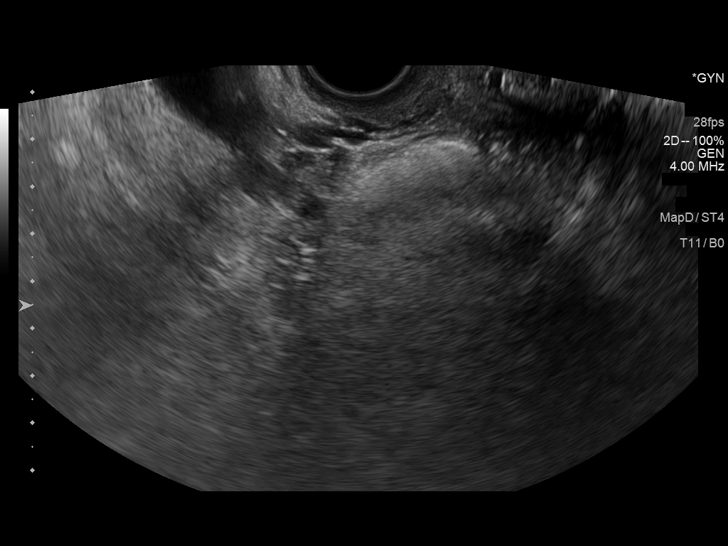
[im 88/106]
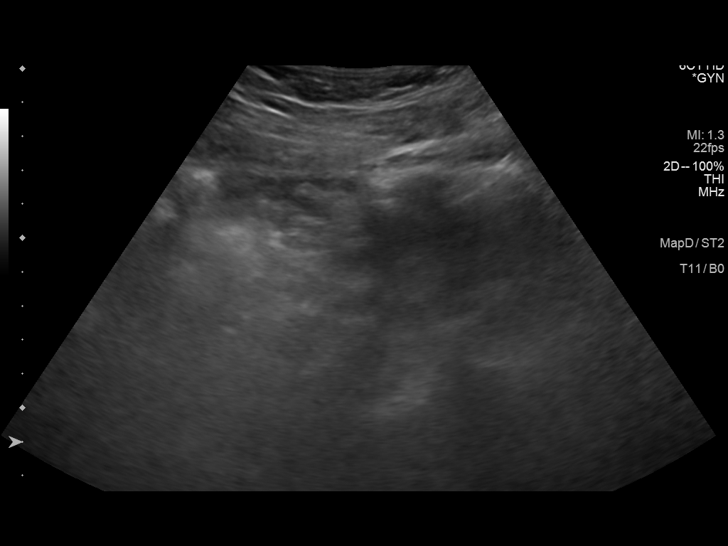
[im 97/106]
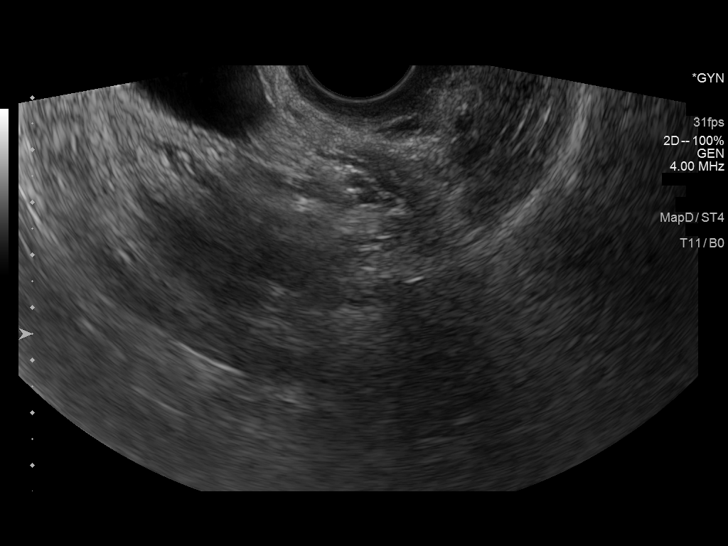
[im 106/106]
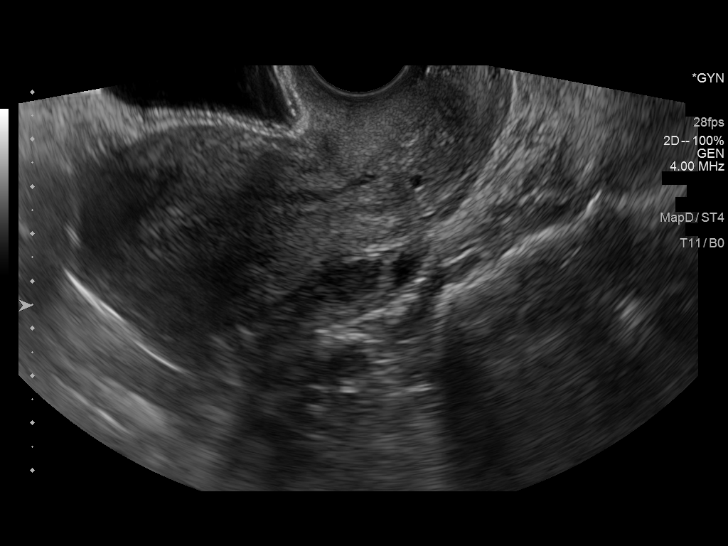

[14 of 25 positions shown; findings below may reference images not displayed]

FINDINGS: Uterus

Measurements: 7.5 x 4.3 x 6.2 cm. Mildly heterogeneous without focal
mass.

Endometrium

Thickness: 11.5 mm.  No focal abnormality visualized.

Right ovary

Measurements: The ovary is not visualized, possibly because of
intervening bowel loops. No adnexal mass.

Left ovary

Measurements: 3.4 x 1.6 x 1.9 cm. Normal appearance/no adnexal mass.

Other findings

No free fluid.
IMPRESSION: 1. Mildly heterogeneous uterus without focal mass identified.
2. Normal appearance of the endometrium.

## 2016-10-31 IMAGING — CR DG LUMBAR SPINE COMPLETE 4+V
5 series · 5 of 5 positions shown · non-contrast
Comparison: SI joint radiographs - earlier same day

CLINICAL DATA: Left-sided low back pain with sciatica.

EXAM:
LUMBAR SPINE - COMPLETE 4+ VIEW

[l-spine ap]
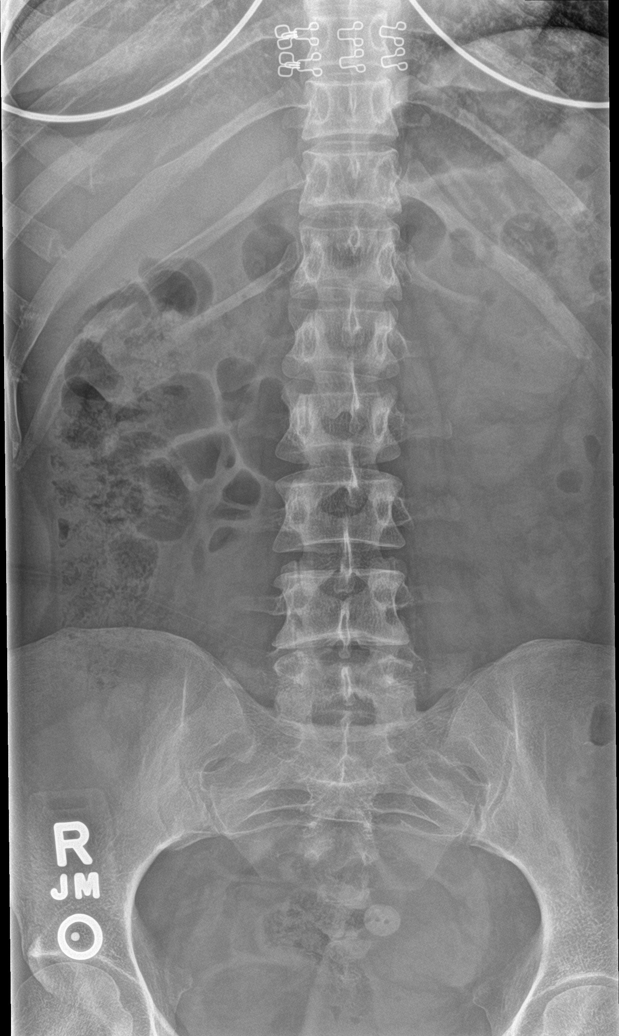

[l-spine obl (1 of 2)]
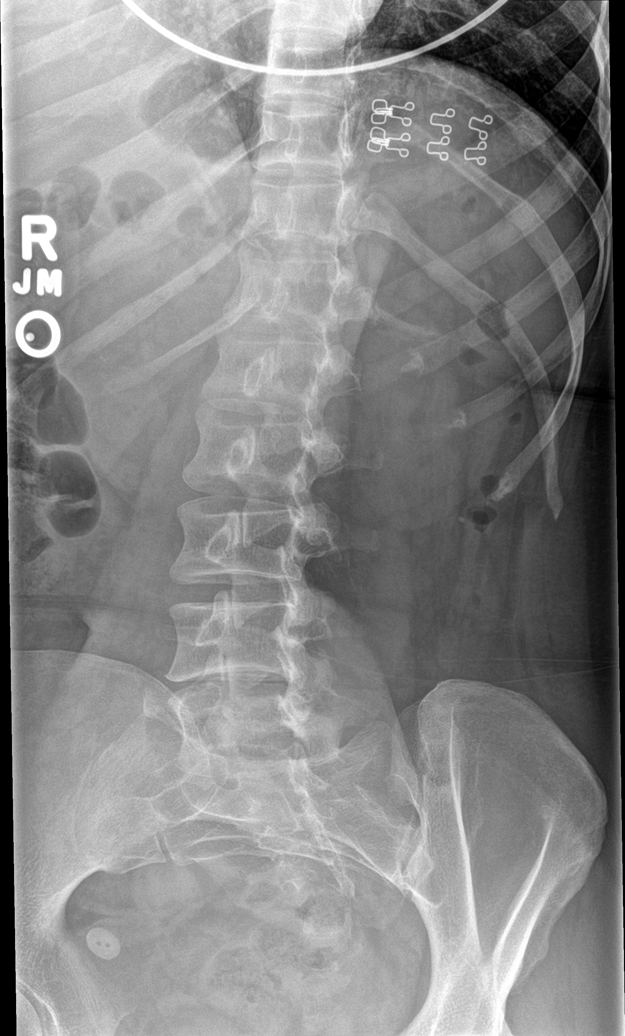

[l-spine obl (2 of 2)]
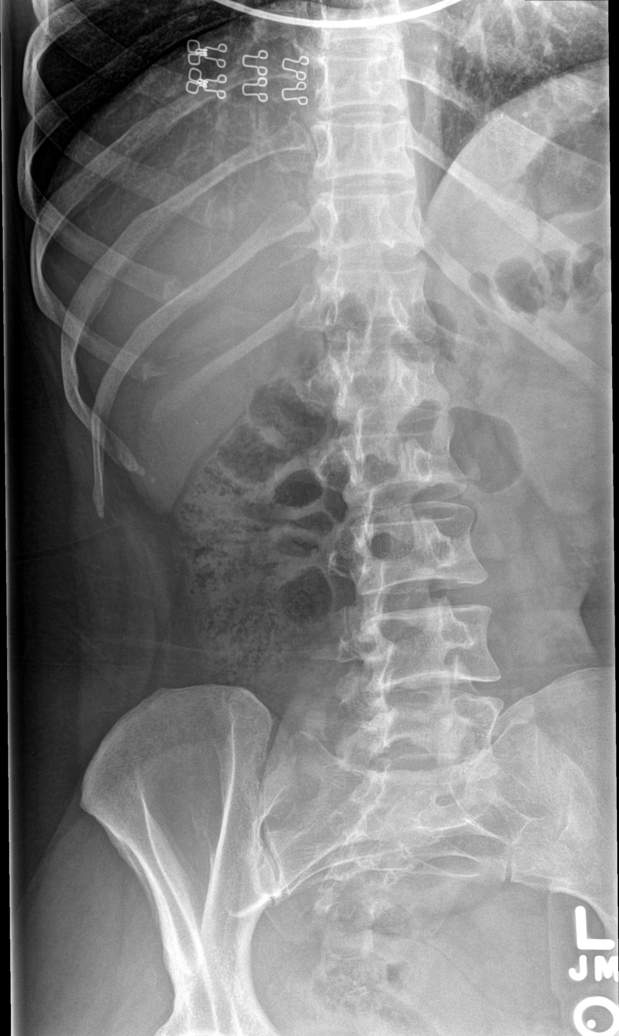

[l-spine lat]
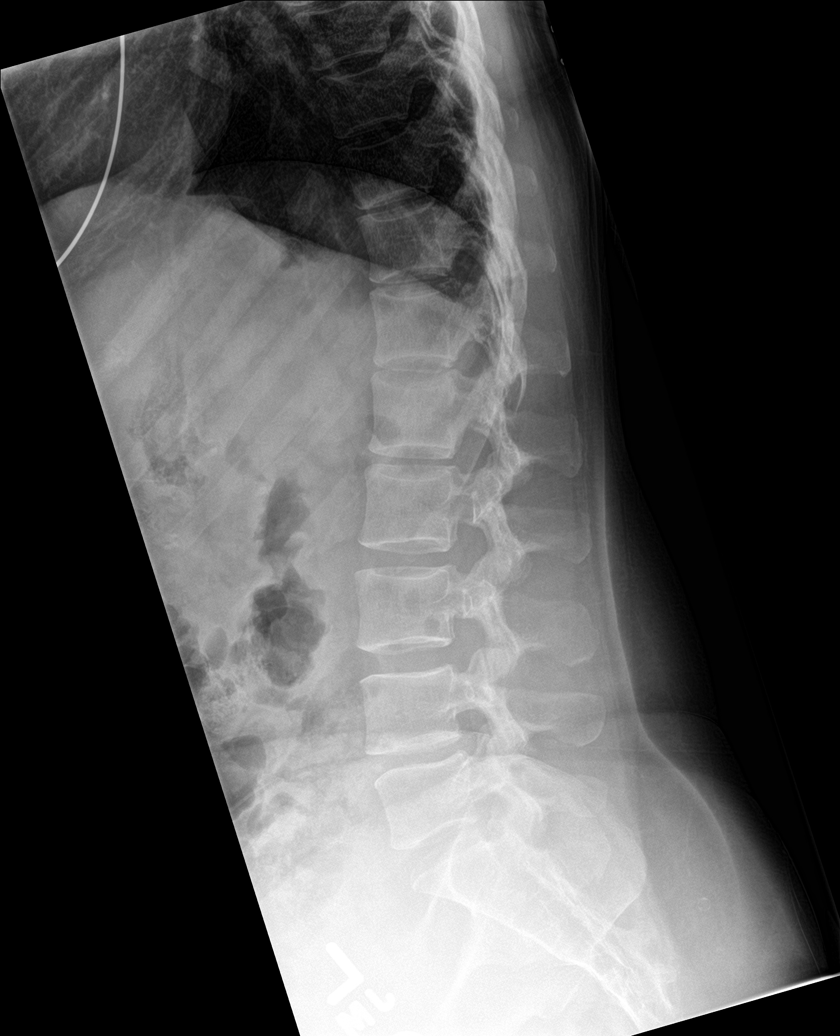

[l-spine spot]
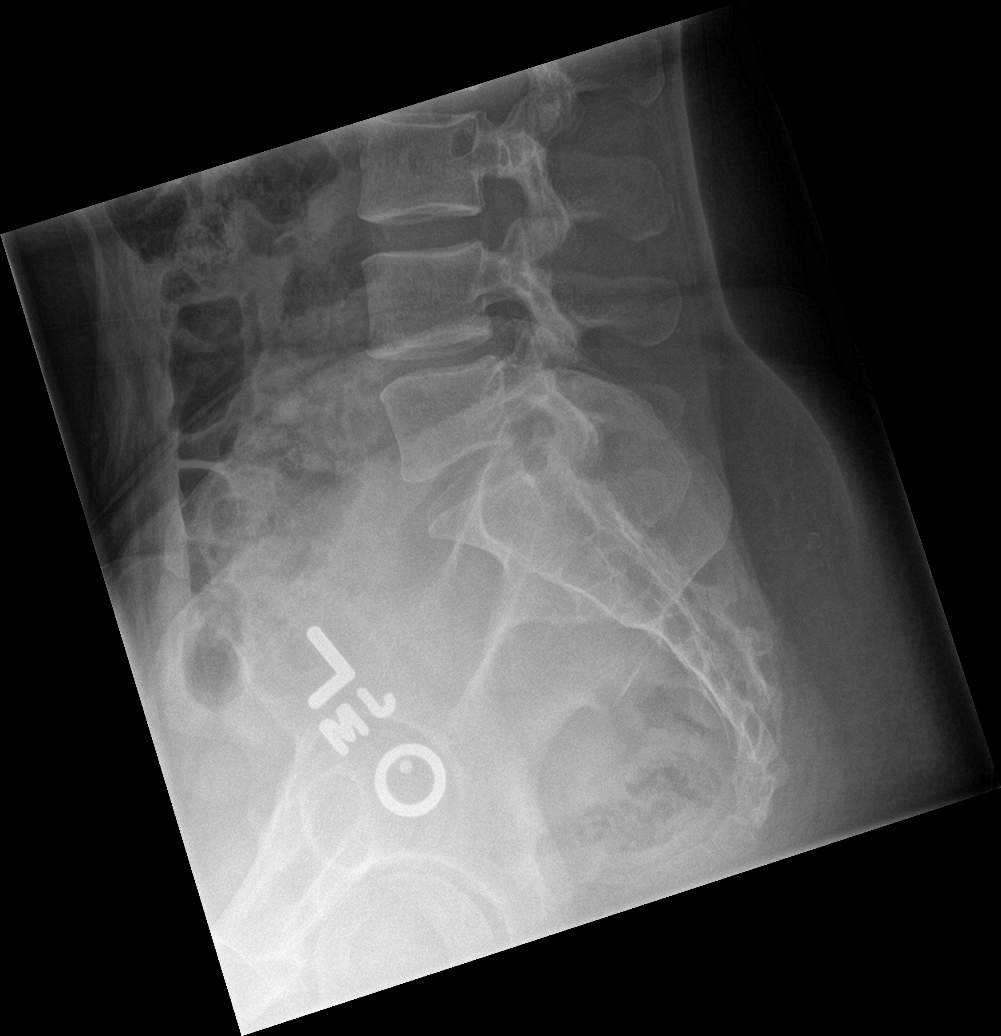

[5 of 5 positions shown; findings below may reference images not displayed]

FINDINGS: There are 5 non rib-bearing lumbar type vertebral bodies.

There is mild straightening expected lumbar lordosis. No
anterolisthesis or retrolisthesis.

Lumbar vertebral body heights are preserved.

There is mild DDD of L4-L5 with disc space height loss, endplate
irregularity and sclerosis. Remaining intervertebral disc space
heights appear preserved.

Limited visualization the bilateral SI joints is normal.

Regional bowel gas pattern and soft tissues appear normal.
IMPRESSION: 1. No acute findings.
2. Mild DDD of L4-L5.

## 2016-10-31 IMAGING — CR DG SI JOINTS 3+V
4 series · 4 of 4 positions shown · non-contrast
Comparison: Lumbar spine series of today's date

CLINICAL DATA: Left-sided low back pain with left-sided sciatic
symptoms, no known injury

EXAM:
BILATERAL SACROILIAC JOINTS - 3+ VIEW

[si joints ap (1 of 2)]
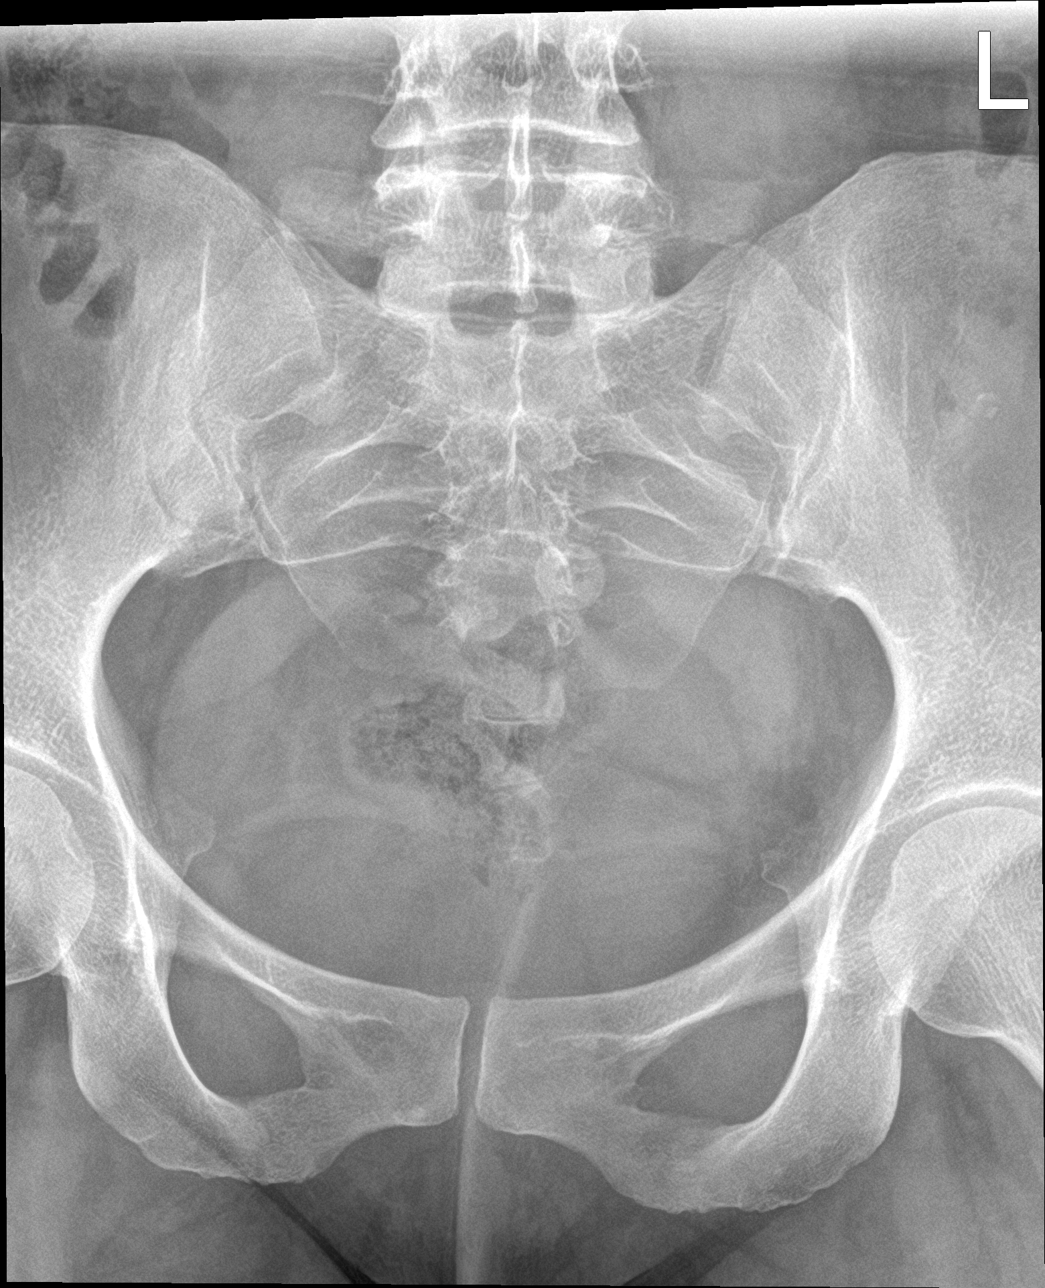

[si joints obl (1 of 2)]
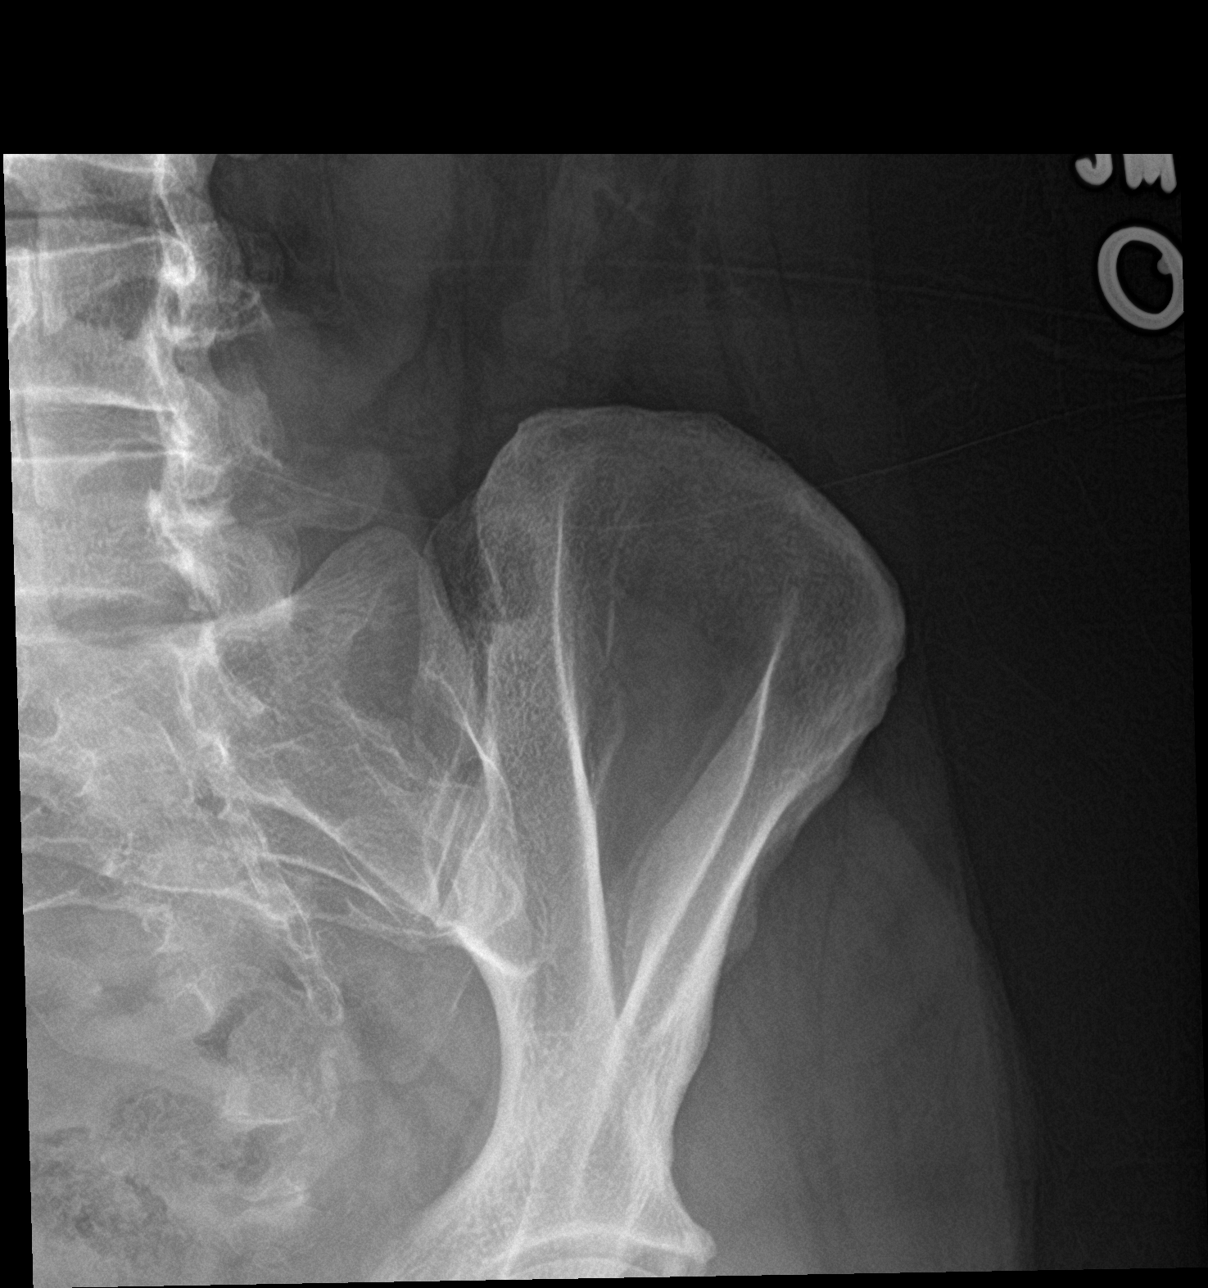

[si joints obl (2 of 2)]
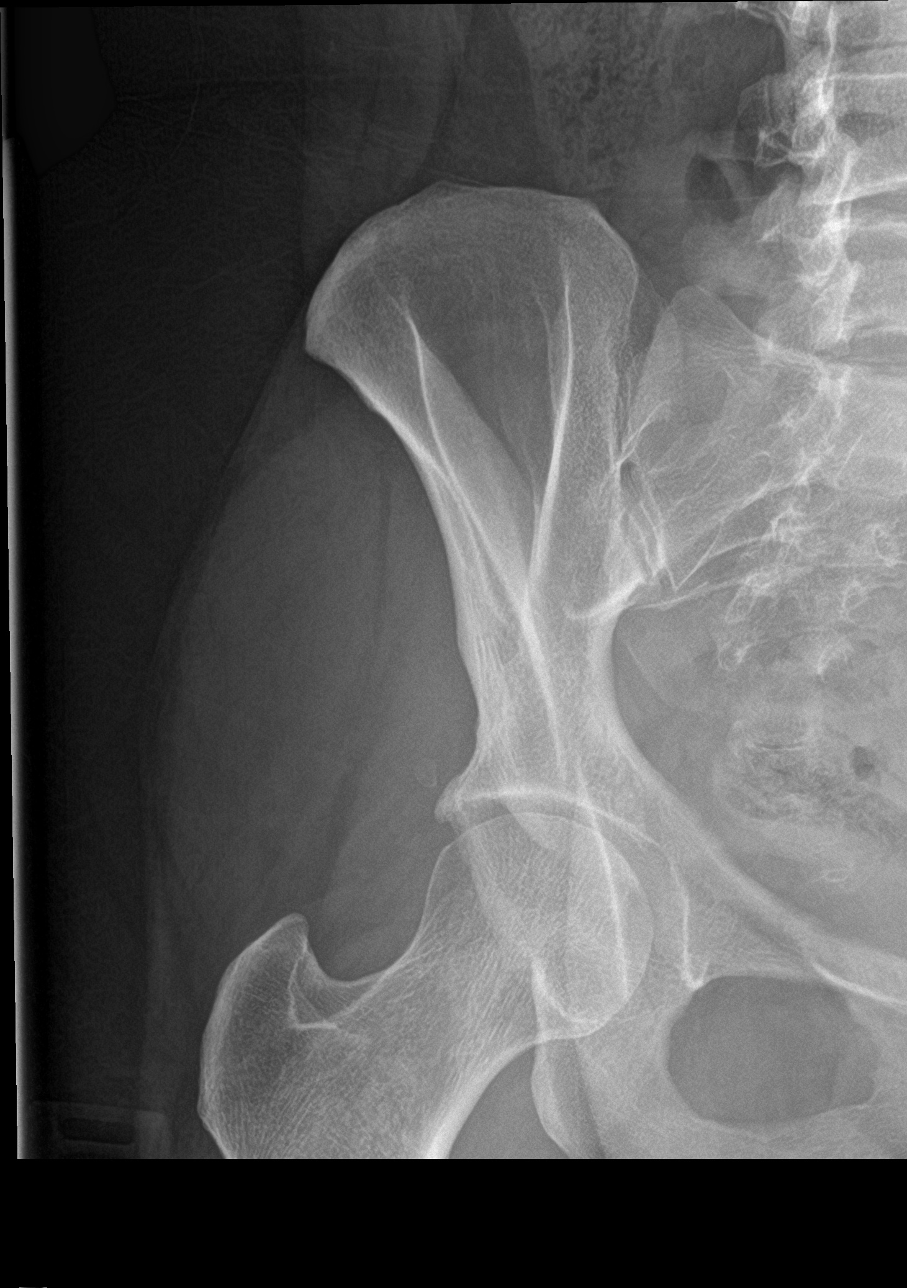

[si joints ap (2 of 2)]
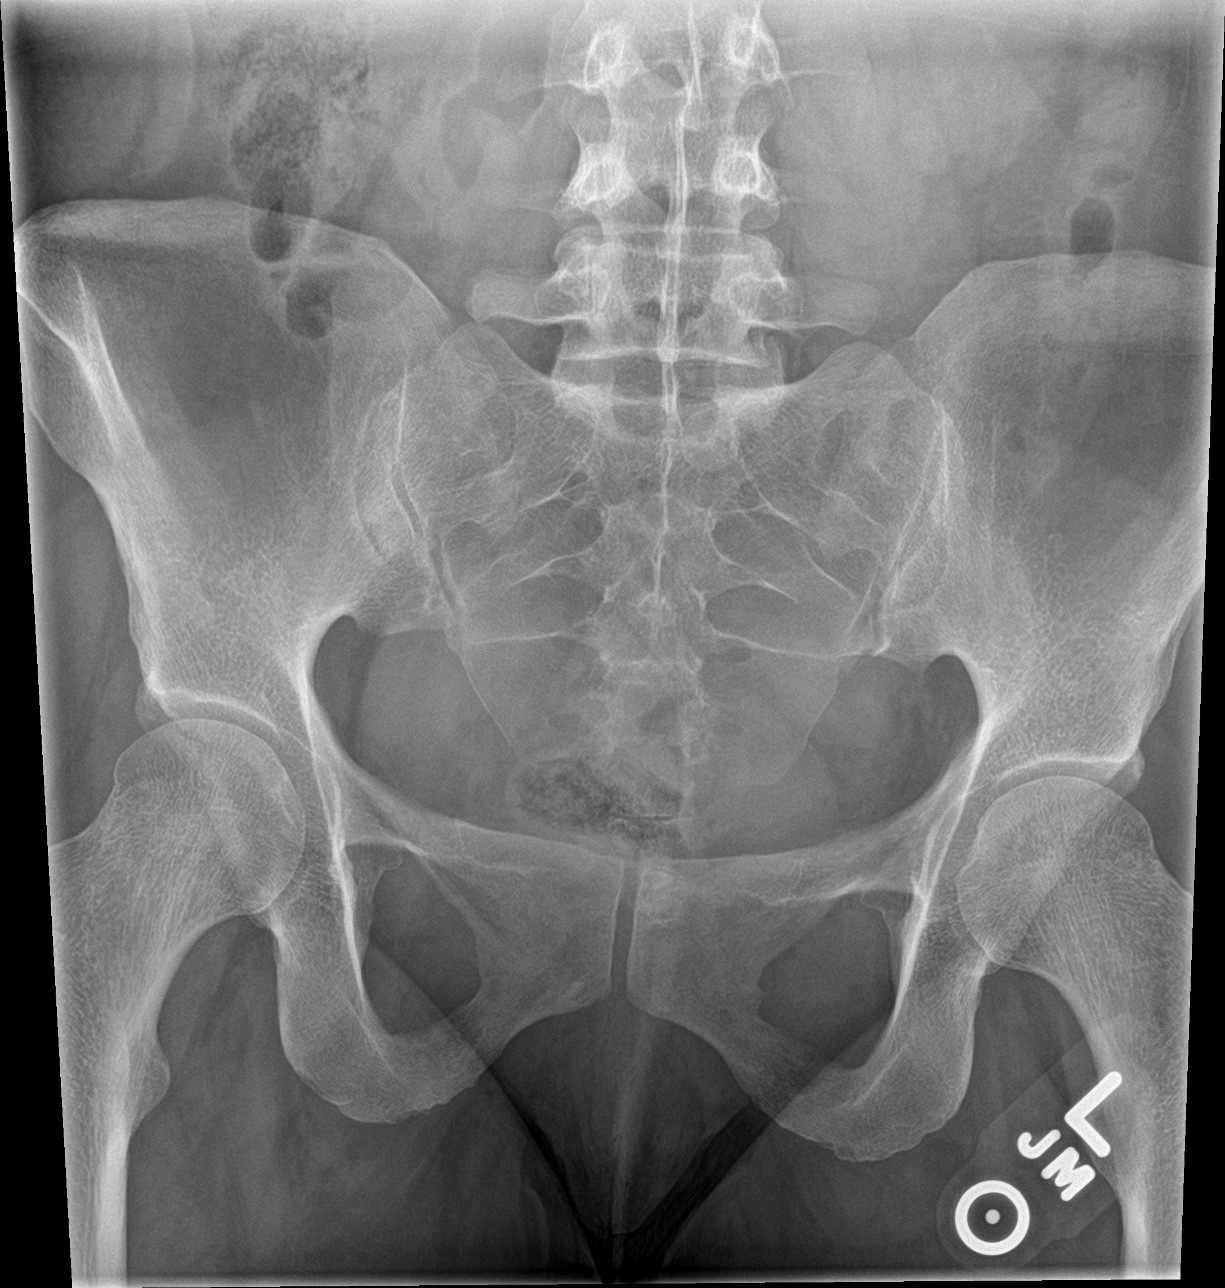

[4 of 4 positions shown; findings below may reference images not displayed]

FINDINGS: The sacrum is adequately mineralized. There are at least 4 intact
sacral struts bilaterally. The SI joints exhibit no erosive change
or bony ankylosis. The observed portions of the bony pelvis are
normal.
IMPRESSION: Normal SI joint series.

## 2017-10-04 IMAGING — US US PELVIS COMPLETE
1 series · 14 of 25 positions shown · non-contrast
Comparison: None

CLINICAL DATA: Right lower quadrant pain and pressure for 2 weeks.

EXAM:
TRANSABDOMINAL AND TRANSVAGINAL ULTRASOUND OF PELVIS
TECHNIQUE: Both transabdominal and transvaginal ultrasound examinations of the
pelvis were performed. Transabdominal technique was performed for
global imaging of the pelvis including uterus, ovaries, adnexal
regions, and pelvic cul-de-sac. It was necessary to proceed with
endovaginal exam following the transabdominal exam to visualize the
uterus, endometrium, ovaries and adnexa .

[Series 1: us pelvis complete · 0.15mm/px · 14 of 75 slices shown]
[im 1/75]
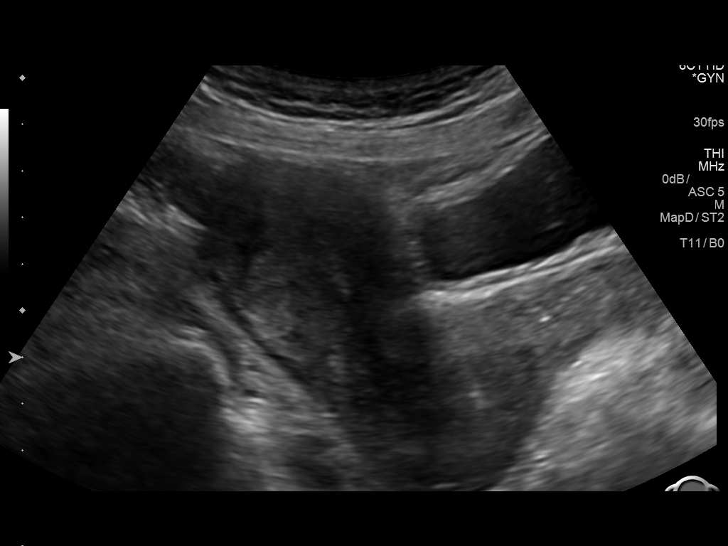
[im 7/75]
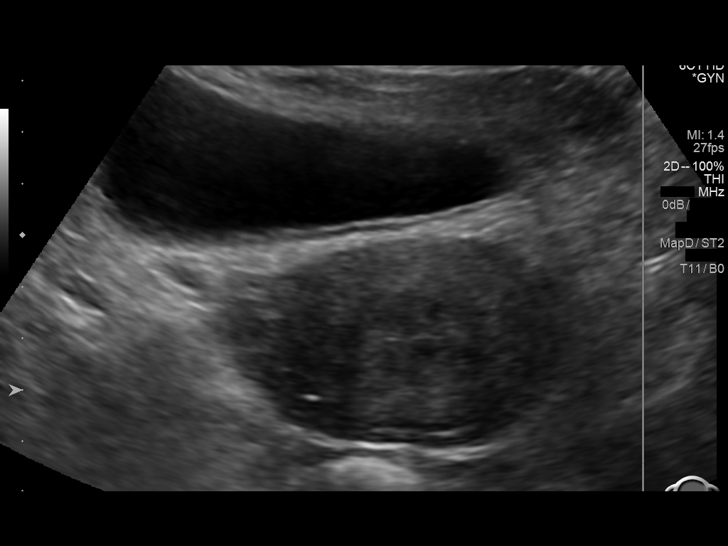
[im 13/75]
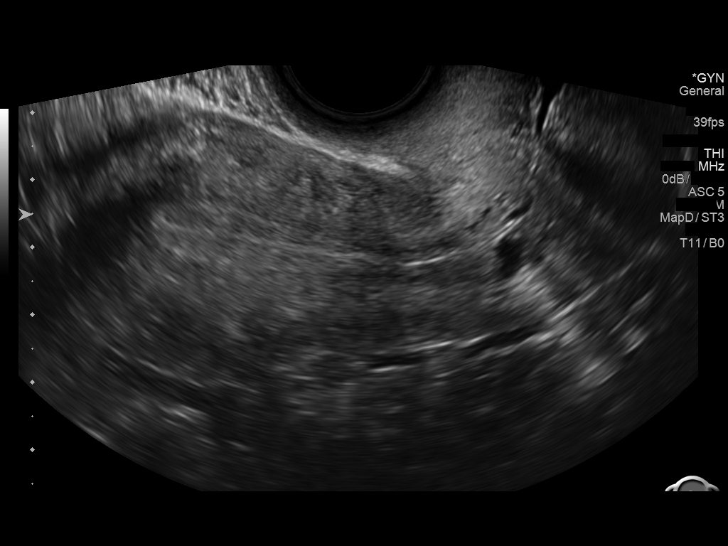
[im 19/75]
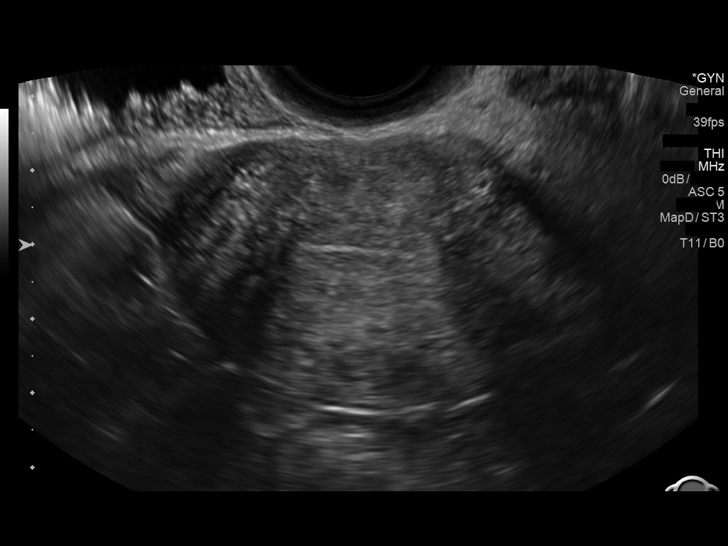
[im 25/75]
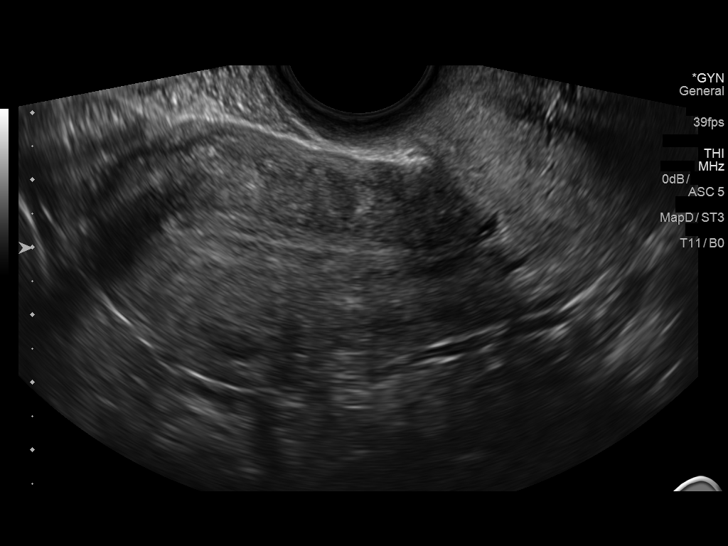
[im 28/75]
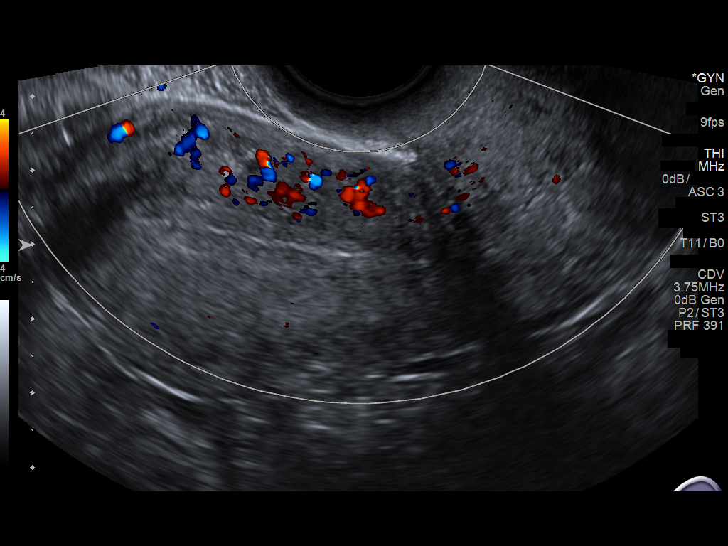
[im 34/75]
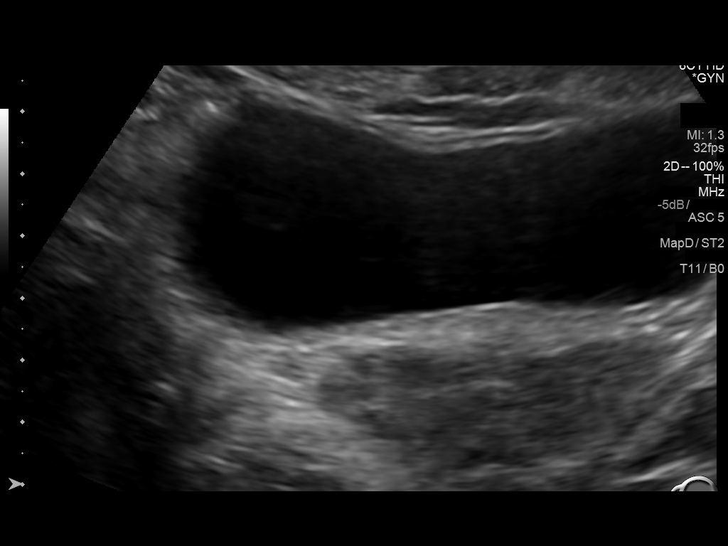
[im 41/75]
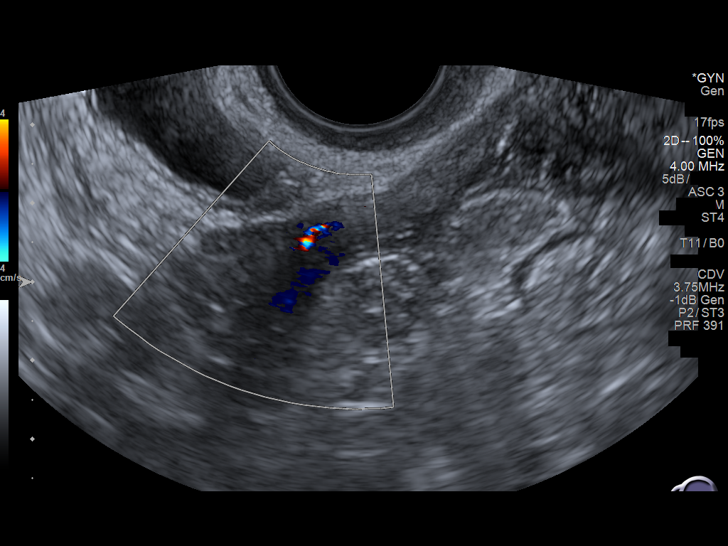
[im 47/75]
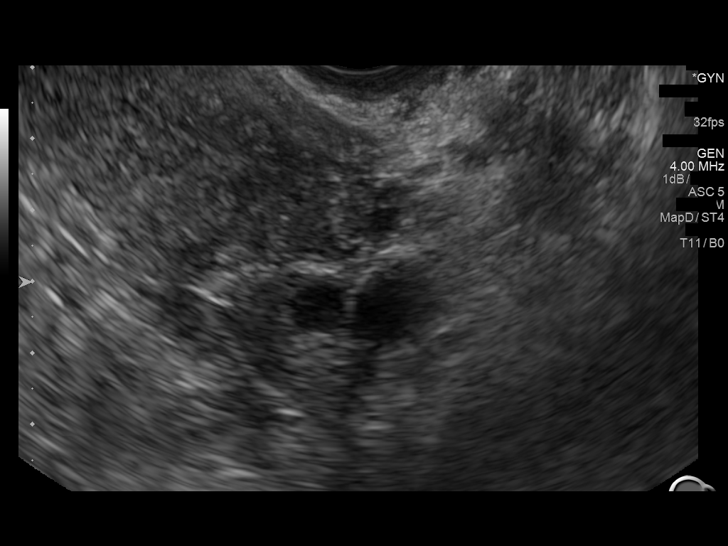
[im 50/75]
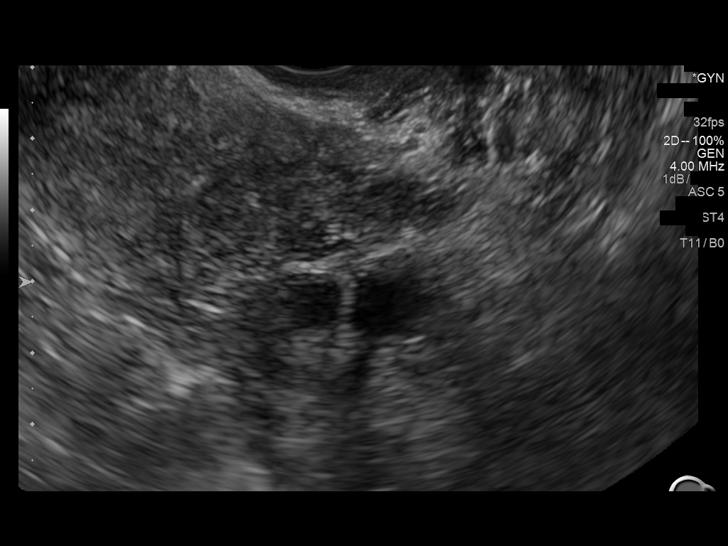
[im 56/75]
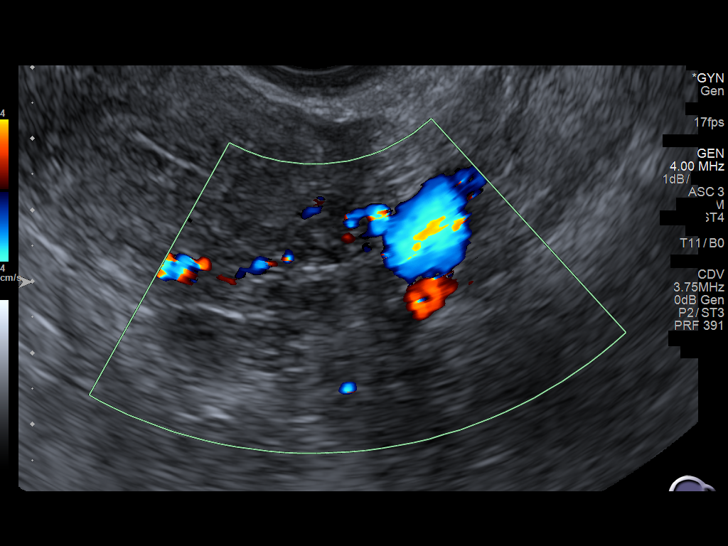
[im 62/75]
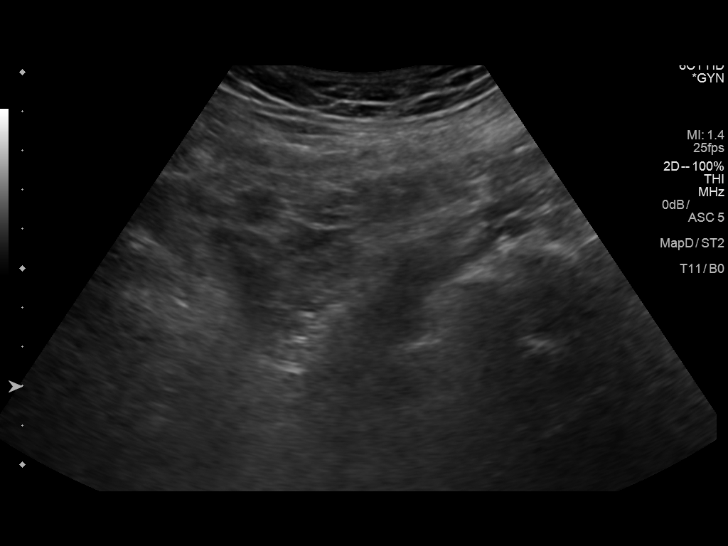
[im 68/75]
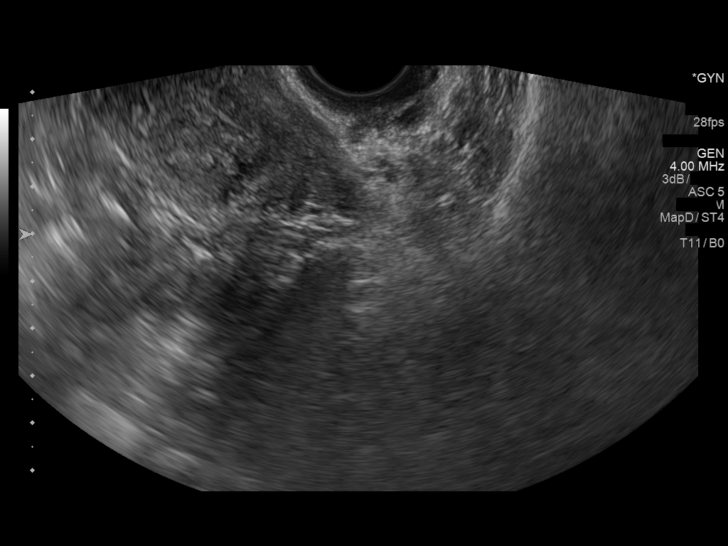
[im 75/75]
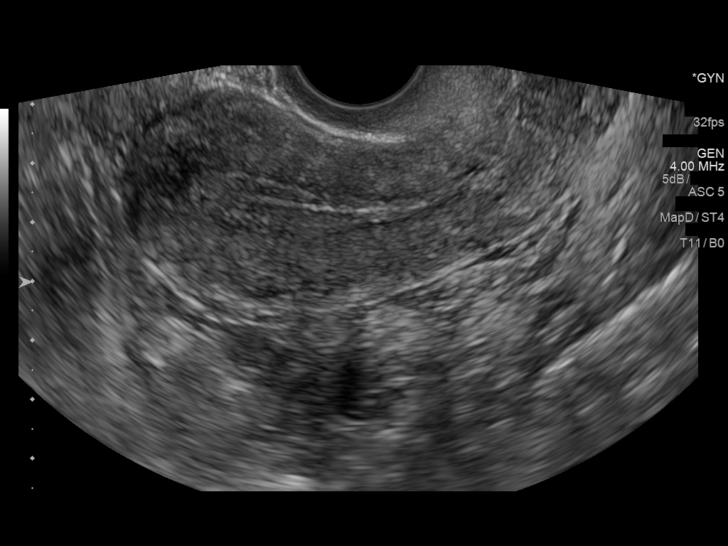

[14 of 25 positions shown; findings below may reference images not displayed]

FINDINGS: Uterus

Measurements: 9.3 x 4.0 x 5.3 cm. No fibroids or other mass
visualized.

Endometrium

Thickness: 7 mm in thickness.  No focal abnormality visualized.

Right ovary

Measurements: 2.2 x 1.5 x 1.8 cm. Normal appearance/no adnexal mass.

Left ovary

Measurements: 2.1 x 1.9 x 2.1 cm. Normal appearance/no adnexal mass.

Other findings

No abnormal free fluid.
IMPRESSION: Unremarkable pelvic ultrasound.
